# Patient Record
Sex: Female | Born: 1990 | Race: White | Hispanic: No | Marital: Single | Smoking: Never smoker
Health system: Southern US, Community
[De-identification: ages and names within clinical notes are randomized; demographics above are authoritative.]

## PROBLEM LIST (undated history)

## (undated) DIAGNOSIS — B019 Varicella without complication: Secondary | ICD-10-CM

## (undated) HISTORY — DX: Varicella without complication: B01.9

---

## 2014-12-18 ENCOUNTER — Ambulatory Visit (INDEPENDENT_AMBULATORY_CARE_PROVIDER_SITE_OTHER): Payer: BC Managed Care – PPO | Admitting: Primary Care

## 2014-12-18 ENCOUNTER — Encounter: Payer: Self-pay | Admitting: Primary Care

## 2014-12-18 VITALS — BP 118/76 | HR 86 | Temp 98.0°F | Ht 65.0 in | Wt 156.8 lb

## 2014-12-18 DIAGNOSIS — Z7189 Other specified counseling: Secondary | ICD-10-CM | POA: Diagnosis not present

## 2014-12-18 DIAGNOSIS — Z7689 Persons encountering health services in other specified circumstances: Secondary | ICD-10-CM

## 2014-12-18 NOTE — Progress Notes (Signed)
   Subjective:    Patient ID: Terry [name], female    DOB: 26-May-1991, 24 y.o.   MRN: [id number]  HPI  Ms. Terry [name] is a 24 year old female who presents today to establish care. She has no complaints today.   Review of Systems  Constitutional: Negative for unexpected weight change.  HENT: Negative for rhinorrhea.   Respiratory: Negative for cough and shortness of breath.   Cardiovascular: Negative for chest pain.  Gastrointestinal: Negative for diarrhea and constipation.  Genitourinary: Negative for dysuria and frequency.  Musculoskeletal: Negative for myalgias and arthralgias.  Skin: Negative for rash.  Neurological: Negative for dizziness and headaches.  Psychiatric/Behavioral:       Denies concerns for anxiety or depression       Past Medical History  Diagnosis Date  . Chicken pox     History   Social History  . Marital Status: Single    Spouse Name: N/A  . Number of Children: N/A  . Years of Education: N/A   Occupational History  . Not on file.   Social History Main Topics  . Smoking status: Never Smoker   . Smokeless tobacco: Not on file  . Alcohol Use: 0.0 oz/week    0 Standard drinks or equivalent per week     Comment: social  . Drug Use: Not on file  . Sexual Activity: Not on file   Other Topics Concern  . Not on file   Social History Narrative   Single.   Highest level of education completed Bachelors Degree.   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   Enjoys reading, running, hiking, camping.       History reviewed. No pertinent past surgical history.  Family History  Problem Relation Age of Onset  . Hypertension Mother   . Mental retardation Mother   . Cancer Maternal Grandmother     breast  . Hypertension Maternal Grandmother     No Known Allergies  No current outpatient prescriptions on file prior to visit.   No current facility-administered medications on file prior to visit.    BP 118/76 mmHg  Pulse 102  Temp(Src) 98 F  (36.7 C) (Oral)  Ht 5\' 5"  (1.651 m)  Wt 156 lb 12.8 oz (71.124 kg)  BMI 26.09 kg/m2  SpO2 98%  LMP 12/15/2014    Objective:   Physical Exam  Constitutional: She is oriented to person, place, and time. She appears well-nourished.  Cardiovascular: Normal rate and regular rhythm.   Pulmonary/Chest: Effort normal and breath sounds normal.  Neurological: She is alert and oriented to person, place, and time.  Skin: Skin is warm and dry.  Psychiatric: She has a normal mood and affect.          Assessment & Plan:

## 2014-12-18 NOTE — Patient Instructions (Signed)
Please schedule a physical with me in the next 2-3 months. You will also schedule a lab only appointment one week prior. We will discuss your lab results during your physical.  It was a pleasure to meet you today! Please don't hesitate to call me with any questions. Welcome to [facility]!

## 2014-12-18 NOTE — Progress Notes (Signed)
Pre visit review using our clinic review tool, if applicable. No additional management support is needed unless otherwise documented below in the visit note. 

## 2015-01-26 ENCOUNTER — Other Ambulatory Visit: Payer: Self-pay | Admitting: Primary Care

## 2015-01-26 DIAGNOSIS — Z0000 Encounter for general adult medical examination without abnormal findings: Secondary | ICD-10-CM

## 2015-02-01 ENCOUNTER — Other Ambulatory Visit (INDEPENDENT_AMBULATORY_CARE_PROVIDER_SITE_OTHER): Payer: BC Managed Care – PPO

## 2015-02-01 DIAGNOSIS — Z136 Encounter for screening for cardiovascular disorders: Secondary | ICD-10-CM | POA: Diagnosis not present

## 2015-02-01 DIAGNOSIS — Z0000 Encounter for general adult medical examination without abnormal findings: Secondary | ICD-10-CM

## 2015-02-01 LAB — CBC
HCT: 39.3 % (ref 36.0–46.0)
HEMOGLOBIN: 13.3 g/dL (ref 12.0–15.0)
MCHC: 33.9 g/dL (ref 30.0–36.0)
MCV: 90 fl (ref 78.0–100.0)
Platelets: 186 10*3/uL (ref 150.0–400.0)
RBC: 4.36 Mil/uL (ref 3.87–5.11)
RDW: 13.1 % (ref 11.5–15.5)
WBC: 4.9 10*3/uL (ref 4.0–10.5)

## 2015-02-01 LAB — LIPID PANEL
Cholesterol: 165 mg/dL (ref 0–200)
HDL: 56.3 mg/dL (ref >39.00)
LDL CALC: 100 mg/dL — AB (ref 0–99)
NonHDL: 109.15
Total CHOL/HDL Ratio: 3
Triglycerides: 44 mg/dL (ref 0.0–149.0)
VLDL: 8.8 mg/dL (ref 0.0–40.0)

## 2015-02-01 LAB — COMPREHENSIVE METABOLIC PANEL
ALT: 10 U/L (ref 0–35)
AST: 16 U/L (ref 0–37)
Albumin: 4.4 g/dL (ref 3.5–5.2)
Alkaline Phosphatase: 50 U/L (ref 39–117)
BILIRUBIN TOTAL: 0.7 mg/dL (ref 0.2–1.2)
BUN: 13 mg/dL (ref 6–23)
CALCIUM: 9.4 mg/dL (ref 8.4–10.5)
CHLORIDE: 102 meq/L (ref 96–112)
CO2: 26 meq/L (ref 19–32)
Creatinine, Ser: 0.77 mg/dL (ref 0.40–1.20)
GFR: 97.63 mL/min (ref >60.00)
GLUCOSE: 90 mg/dL (ref 70–99)
Potassium: 3.8 mEq/L (ref 3.5–5.1)
Sodium: 136 mEq/L (ref 135–145)
Total Protein: 7.1 g/dL (ref 6.0–8.3)

## 2015-02-01 LAB — TSH: TSH: 2.35 u[IU]/mL (ref 0.35–4.50)

## 2015-02-06 ENCOUNTER — Encounter: Payer: BC Managed Care – PPO | Admitting: Primary Care

## 2015-02-07 ENCOUNTER — Encounter: Payer: Self-pay | Admitting: Primary Care

## 2015-02-07 ENCOUNTER — Ambulatory Visit (INDEPENDENT_AMBULATORY_CARE_PROVIDER_SITE_OTHER): Payer: BC Managed Care – PPO | Admitting: Primary Care

## 2015-02-07 VITALS — BP 112/72 | HR 87 | Temp 98.5°F | Ht 65.0 in | Wt 157.8 lb

## 2015-02-07 DIAGNOSIS — Z0000 Encounter for general adult medical examination without abnormal findings: Secondary | ICD-10-CM | POA: Diagnosis not present

## 2015-02-07 NOTE — Progress Notes (Signed)
Subjective:    Patient ID: Terry [name], female    DOB: 05-Sep-1990, 24 y.o.   MRN: [id number]  HPI  Terry [name] is a 24 year old female who presents today for complete physical.  Immunizations: -Tetanus: Completed in 2010. -Influenza: Did receive last season.   Diet: She endorses a healthy diet. Breakfast: Oatmeal or cereal Lunch: Salads, beans and rice, sandwiches Dinner: Pasta, chicken, vegetables Desserts occasionally. Beverages: Mostly water, coffee in the morning. Exercise: She runs on the treadmill 3-4 days weekly. During the school year also walks and uses elliptical. Eye exam: Completed October 2015. Dental exam: Needs to make an appointment Pap Smear: Complete in 2014.   Review of Systems  Constitutional: Negative for unexpected weight change.  HENT: Negative for rhinorrhea.   Respiratory: Negative for cough and shortness of breath.   Cardiovascular: Negative for chest pain.  Gastrointestinal: Negative for diarrhea and constipation.  Genitourinary: Negative for difficulty urinating.       Regular periods  Musculoskeletal: Negative for myalgias and arthralgias.  Skin: Negative for rash.  Neurological: Negative for dizziness, numbness and headaches.  Psychiatric/Behavioral:       Denies concerns for anxiety or depression       Past Medical History  Diagnosis Date  . Chicken pox     Social History   Social History  . Marital Status: Single    Spouse Name: N/A  . Number of Children: N/A  . Years of Education: N/A   Occupational History  . Not on file.   Social History Main Topics  . Smoking status: Never Smoker   . Smokeless tobacco: Not on file  . Alcohol Use: 0.0 oz/week    0 Standard drinks or equivalent per week     Comment: social  . Drug Use: Not on file  . Sexual Activity: Not on file   Other Topics Concern  . Not on file   Social History Narrative   Single.   Highest level of education completed Bachelors Degree.   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   Enjoys reading, running, hiking, camping.       No past surgical history on file.  Family History  Problem Relation Age of Onset  . Hypertension Mother   . Mental retardation Mother   . Cancer Maternal Grandmother     breast  . Hypertension Maternal Grandmother     No Known Allergies  No current outpatient prescriptions on file prior to visit.   No current facility-administered medications on file prior to visit.    BP 112/72 mmHg  Pulse 87  Temp(Src) 98.5 F (36.9 C) (Oral)  Ht  (1.651 m)  Wt 157 lb 12.8 oz (71.578 kg)  BMI 26.26 kg/m2  SpO2 98%  LMP 02/06/2015    Objective:   Physical Exam  Constitutional: She is oriented to person, place, and time. She appears well-nourished.  HENT:  Right Ear: Tympanic membrane and ear canal normal.  Left Ear: Tympanic membrane and ear canal normal.  Mouth/Throat: Oropharynx is clear and moist.  Eyes: Conjunctivae and EOM are normal. Pupils are equal, round, and reactive to light.  Neck: Neck supple. No thyromegaly present.  Cardiovascular: Normal rate and regular rhythm.   Pulmonary/Chest: Effort normal and breath sounds normal.  Abdominal: Soft. Bowel sounds are normal. There is no tenderness.  Musculoskeletal: Normal range of motion.  Lymphadenopathy:    She has no cervical adenopathy.  Neurological: She is alert and oriented to person,  place, and time. She has normal reflexes. No cranial nerve deficit.  Skin: Skin is warm and dry.  Psychiatric: She has a normal mood and affect.          Assessment & Plan:

## 2015-02-07 NOTE — Assessment & Plan Note (Signed)
Tetanus due in 2020. Pap due in 2017. Labs unremarkable. Exam unremarkable Discussed the importance of healthy diet and regular exercise as she is already working towards. Follow up in 1 year.

## 2015-02-07 NOTE — Patient Instructions (Signed)
Your labs look great!  Continue working on exercise and eating a healthy diet.  Follow up in one year for physical and pap.  It was a pleasure to see you today!

## 2015-02-07 NOTE — Progress Notes (Signed)
Pre visit review using our clinic review tool, if applicable. No additional management support is needed unless otherwise documented below in the visit note. 

## 2016-04-25 ENCOUNTER — Ambulatory Visit (INDEPENDENT_AMBULATORY_CARE_PROVIDER_SITE_OTHER): Payer: BC Managed Care – PPO | Admitting: Primary Care

## 2016-04-25 ENCOUNTER — Encounter: Payer: Self-pay | Admitting: Primary Care

## 2016-04-25 ENCOUNTER — Other Ambulatory Visit (HOSPITAL_COMMUNITY)
Admission: RE | Admit: 2016-04-25 | Discharge: 2016-04-25 | Disposition: A | Discharge status: Home / Self Care | Payer: BC Managed Care – PPO | Source: Ambulatory Visit | Attending: Primary Care | Admitting: Primary Care

## 2016-04-25 VITALS — BP 126/84 | HR 98 | Temp 98.0°F | Ht 64.5 in | Wt 133.0 lb

## 2016-04-25 DIAGNOSIS — Z0000 Encounter for general adult medical examination without abnormal findings: Secondary | ICD-10-CM

## 2016-04-25 DIAGNOSIS — Z01419 Encounter for gynecological examination (general) (routine) without abnormal findings: Secondary | ICD-10-CM | POA: Diagnosis present

## 2016-04-25 DIAGNOSIS — Z1151 Encounter for screening for human papillomavirus (HPV): Secondary | ICD-10-CM | POA: Diagnosis present

## 2016-04-25 NOTE — Patient Instructions (Addendum)
Complete lab work prior to leaving today. I will notify you of your results once received.   We will notify you once we receive the results of your pap.  Continue your efforts towards a healthy lifestyle through healthy diet and regular exercise.  Ensure you are consuming 64 ounces of water daily.  Follow up in 1 year for your annual physical.  It was a pleasure to see you today!

## 2016-04-25 NOTE — Assessment & Plan Note (Signed)
Immunizations UTD. Pap due, completed today, pending results. Breast exam completed, normal. Exam unremarkable. Labs pending. Commended her on her healthy lifestyle, encouraged her to continue. Follow up in 1 year for repeat physical.

## 2016-04-25 NOTE — Progress Notes (Signed)
Subjective:    Patient ID: Terry [name], female    DOB: 10-Oct-1990, 25 y.o.   MRN: [id number]  HPI  Ms. Basulto is a 25 year old female who presents today for complete physical.  Immunizations: -Tetanus: Completed in 2010 -Influenza: Completed in October 2017   Diet: She endorses a fair diet. Breakfast: Oatmeal, banana, coffe Lunch: Beans, rice, fruit, soup Dinner: Chicken, vegetables, fish Snacks: Occasionally, crackers, cheese Desserts: Occasionally  Beverages: Water  Exercise: She runs 10-15 miles weekly Eye exam: Completed in October 2016 Dental exam: Completes semi-annually  Pap Smear: Due.    Review of Systems  Constitutional: Negative for unexpected weight change.  HENT: Negative for rhinorrhea.   Respiratory: Negative for cough and shortness of breath.   Cardiovascular: Negative for chest pain.  Gastrointestinal: Negative for constipation and diarrhea.  Genitourinary: Negative for difficulty urinating and menstrual problem.  Musculoskeletal: Negative for arthralgias and myalgias.  Skin: Negative for rash.  Allergic/Immunologic: Positive for environmental allergies.  Neurological: Negative for dizziness, numbness and headaches.  Psychiatric/Behavioral:       Denies concerns for anxiety and depression       Past Medical History:  Diagnosis Date  . Chicken pox      Social History   Social History  . Marital status: Single    Spouse name: N/A  . Number of children: N/A  . Years of education: N/A   Occupational History  . Not on file.   Social History Main Topics  . Smoking status: Never Smoker  . Smokeless tobacco: Not on file  . Alcohol use 0.0 oz/week     Comment: social  . Drug use: Unknown  . Sexual activity: Not on file   Other Topics Concern  . Not on file   Social History Narrative   Single.   Highest level of education completed Bachelors Degree.   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   Enjoys reading, running, hiking,  camping.       No past surgical history on file.  Family History  Problem Relation Age of Onset  . Hypertension Mother   . Mental retardation Mother   . Cancer Maternal Grandmother     breast  . Hypertension Maternal Grandmother     No Known Allergies  No current outpatient prescriptions on file prior to visit.   No current facility-administered medications on file prior to visit.     BP 126/84 (BP Location: Left Arm, Patient Position: Sitting, Cuff Size: Normal)   Pulse 98   Temp 98 F (36.7 C) (Oral)   Ht 5' 4.5" (1.638 m)   Wt 133 lb (60.3 kg)   LMP 04/11/2016 (Exact Date)   SpO2 99%   BMI 22.48 kg/m    Objective:   Physical Exam  Constitutional: She is oriented to person, place, and time. She appears well-nourished.  HENT:  Right Ear: Tympanic membrane and ear canal normal.  Left Ear: Tympanic membrane and ear canal normal.  Nose: Nose normal.  Mouth/Throat: Oropharynx is clear and moist.  Eyes: Conjunctivae and EOM are normal. Pupils are equal, round, and reactive to light.  Neck: Neck supple. No thyromegaly present.  Cardiovascular: Normal rate and regular rhythm.   No murmur heard. Pulmonary/Chest: Effort normal and breath sounds normal. She has no rales. Right breast exhibits no mass, no skin change and no tenderness. Left breast exhibits no mass, no skin change and no tenderness.  Dense breast tissue bilaterally.  Abdominal: Soft. Bowel sounds are  normal. There is no tenderness.  Genitourinary: There is no lesion on the right labia. There is no lesion on the left labia. Cervix exhibits no motion tenderness and no discharge. Right adnexum displays no tenderness. Left adnexum displays no tenderness. No vaginal discharge found.  Musculoskeletal: Normal range of motion.  Lymphadenopathy:    She has no cervical adenopathy.  Neurological: She is alert and oriented to person, place, and time. She has normal reflexes. No cranial nerve deficit.  Skin: Skin is  warm and dry. No rash noted.  Psychiatric: She has a normal mood and affect.          Assessment & Plan:

## 2016-04-25 NOTE — Progress Notes (Signed)
Pre visit review using our clinic review tool, if applicable. No additional management support is needed unless otherwise documented below in the visit note. 

## 2016-04-26 LAB — COMPREHENSIVE METABOLIC PANEL
ALT: 11 U/L (ref 6–29)
AST: 17 U/L (ref 10–30)
Albumin: 4.2 g/dL (ref 3.6–5.1)
Alkaline Phosphatase: 45 U/L (ref 33–115)
BUN: 10 mg/dL (ref 7–25)
CHLORIDE: 103 mmol/L (ref 98–110)
CO2: 24 mmol/L (ref 20–31)
CREATININE: 0.68 mg/dL (ref 0.50–1.10)
Calcium: 8.9 mg/dL (ref 8.6–10.2)
Glucose, Bld: 96 mg/dL (ref 65–99)
POTASSIUM: 3.6 mmol/L (ref 3.5–5.3)
SODIUM: 138 mmol/L (ref 135–146)
TOTAL PROTEIN: 6.6 g/dL (ref 6.1–8.1)
Total Bilirubin: 0.5 mg/dL (ref 0.2–1.2)

## 2016-04-26 LAB — LIPID PANEL
CHOL/HDL RATIO: 2.3 ratio (ref <=5.0)
Cholesterol: 145 mg/dL (ref 125–200)
HDL: 62 mg/dL (ref >=46)
LDL CALC: 76 mg/dL (ref <130)
TRIGLYCERIDES: 34 mg/dL (ref <150)
VLDL: 7 mg/dL (ref <30)

## 2016-04-30 LAB — CYTOLOGY - PAP
Diagnosis: NEGATIVE
HPV (WINDOPATH): NOT DETECTED

## 2017-06-17 ENCOUNTER — Ambulatory Visit (INDEPENDENT_AMBULATORY_CARE_PROVIDER_SITE_OTHER): Payer: BC Managed Care – PPO | Admitting: Primary Care

## 2017-06-17 ENCOUNTER — Encounter: Payer: Self-pay | Admitting: Primary Care

## 2017-06-17 VITALS — BP 118/76 | HR 85 | Temp 98.0°F | Wt 129.8 lb

## 2017-06-17 DIAGNOSIS — Z0000 Encounter for general adult medical examination without abnormal findings: Secondary | ICD-10-CM | POA: Diagnosis not present

## 2017-06-17 NOTE — Patient Instructions (Signed)
Stop by the lab prior to leaving today. I will notify you of your results once received.   Continue exercising. You should be getting 150 minutes of moderate intensity exercise weekly.  Continue to work on a healthy diet.  Follow up in 1 year for your annual exam or sooner if needed.  It was a pleasure to see you today!

## 2017-06-17 NOTE — Assessment & Plan Note (Signed)
Immunizations UTD. Pap UTD. Commended her on a healthy diet and regular exercise. Exam unremarkable. Labs pending. Follow up in 1 year.

## 2017-06-17 NOTE — Progress Notes (Signed)
Subjective:    Patient ID: Terry [name], female    DOB: 06/26/1991, 26 y.o.   MRN: [id number]  HPI  Terry [name] is a 26 year old female who presents today for complete physical.  Immunizations: -Tetanus: Completed in 2010 -Influenza: Completed in October 2018   Diet: She endorses a healthy diet. Breakfast: Oatmeal, fruit, eggs Lunch: Soup, salad Dinner: Meat, vegetable, starch Snacks: Veggies with humus, fruit, trail mix Desserts: fruit mostly Beverages: Coffee, water, occasional diet soda  Exercise: She runs 10-15 miles weekly, low impact exercise videos  Eye exam: Completed in December 2018 Dental exam: Completes semi-annually Pap Smear: Negative in 2017   Review of Systems  Constitutional: Negative for unexpected weight change.  HENT: Negative for rhinorrhea.   Respiratory: Negative for cough and shortness of breath.   Cardiovascular: Negative for chest pain.  Gastrointestinal: Negative for constipation and diarrhea.  Genitourinary: Negative for difficulty urinating and menstrual problem.  Musculoskeletal: Negative for arthralgias and myalgias.  Skin: Negative for rash.  Allergic/Immunologic: Negative for environmental allergies.  Neurological: Negative for dizziness, numbness and headaches.  Psychiatric/Behavioral:       Denies concerns for anxiety and depression       Past Medical History:  Diagnosis Date  . Chicken pox      Social History   Socioeconomic History  . Marital status: Single    Spouse name: Not on file  . Number of children: Not on file  . Years of education: Not on file  . Highest education level: Not on file  Social Needs  . Financial resource strain: Not on file  . Food insecurity - worry: Not on file  . Food insecurity - inability: Not on file  . Transportation needs - medical: Not on file  . Transportation needs - non-medical: Not on file  Occupational History  . Not on file  Tobacco Use  . Smoking status: Never Smoker  .  Smokeless tobacco: Never Used  Substance and Sexual Activity  . Alcohol use: Yes    Alcohol/week: 0.0 oz    Comment: social  . Drug use: Not on file  . Sexual activity: Not on file  Other Topics Concern  . Not on file  Social History Narrative   Single.   Highest level of education completed Bachelors Degree.   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   Enjoys reading, running, hiking, camping.    No past surgical history on file.  Family History  Problem Relation Age of Onset  . Hypertension Mother   . Mental retardation Mother   . Cancer Maternal Grandmother        breast  . Hypertension Maternal Grandmother     No Known Allergies  No current outpatient medications on file prior to visit.   No current facility-administered medications on file prior to visit.     BP 118/76   Pulse 85   Temp 98 F (36.7 C) (Oral)   Wt 129 lb 12.8 oz (58.9 kg)   LMP 05/31/2017   SpO2 98%   BMI 21.94 kg/m    Objective:   Physical Exam  Constitutional: She is oriented to person, place, and time. She appears well-nourished.  HENT:  Right Ear: Tympanic membrane and ear canal normal.  Left Ear: Tympanic membrane and ear canal normal.  Nose: Nose normal.  Mouth/Throat: Oropharynx is clear and moist.  Eyes: Conjunctivae and EOM are normal. Pupils are equal, round, and reactive to light.  Neck: Neck supple. No  thyromegaly present.  Cardiovascular: Normal rate and regular rhythm.  No murmur heard. Pulmonary/Chest: Effort normal and breath sounds normal. She has no rales.  Abdominal: Soft. Bowel sounds are normal. There is no tenderness.  Musculoskeletal: Normal range of motion.  Lymphadenopathy:    She has no cervical adenopathy.  Neurological: She is alert and oriented to person, place, and time. She has normal reflexes. No cranial nerve deficit.  Skin: Skin is warm and dry. No rash noted.  Psychiatric: She has a normal mood and affect.          Assessment & Plan:

## 2017-06-18 LAB — COMPREHENSIVE METABOLIC PANEL
ALBUMIN: 4.2 g/dL (ref 3.5–5.2)
ALK PHOS: 47 U/L (ref 39–117)
ALT: 11 U/L (ref 0–35)
AST: 16 U/L (ref 0–37)
BILIRUBIN TOTAL: 0.4 mg/dL (ref 0.2–1.2)
BUN: 18 mg/dL (ref 6–23)
CALCIUM: 9 mg/dL (ref 8.4–10.5)
CO2: 27 mEq/L (ref 19–32)
Chloride: 102 mEq/L (ref 96–112)
Creatinine, Ser: 0.89 mg/dL (ref 0.40–1.20)
GFR: 81.05 mL/min (ref >60.00)
GLUCOSE: 90 mg/dL (ref 70–99)
POTASSIUM: 3.5 meq/L (ref 3.5–5.1)
Sodium: 136 mEq/L (ref 135–145)
TOTAL PROTEIN: 7 g/dL (ref 6.0–8.3)

## 2017-06-18 LAB — LIPID PANEL
CHOLESTEROL: 141 mg/dL (ref 0–200)
HDL: 58.8 mg/dL (ref >39.00)
LDL Cholesterol: 72 mg/dL (ref 0–99)
NONHDL: 82.45
TRIGLYCERIDES: 52 mg/dL (ref 0.0–149.0)
Total CHOL/HDL Ratio: 2
VLDL: 10.4 mg/dL (ref 0.0–40.0)

## 2018-03-30 ENCOUNTER — Ambulatory Visit: Payer: BC Managed Care – PPO | Admitting: Family Medicine

## 2018-03-30 ENCOUNTER — Ambulatory Visit (INDEPENDENT_AMBULATORY_CARE_PROVIDER_SITE_OTHER)
Admission: RE | Admit: 2018-03-30 | Discharge: 2018-03-30 | Disposition: A | Discharge status: Home / Self Care | Payer: BC Managed Care – PPO | Source: Ambulatory Visit | Attending: Family Medicine | Admitting: Family Medicine

## 2018-03-30 ENCOUNTER — Encounter: Payer: Self-pay | Admitting: Family Medicine

## 2018-03-30 VITALS — BP 116/74 | HR 93 | Temp 99.2°F | Ht 64.5 in | Wt 133.5 lb

## 2018-03-30 DIAGNOSIS — M79673 Pain in unspecified foot: Secondary | ICD-10-CM | POA: Diagnosis not present

## 2018-03-30 NOTE — Patient Instructions (Signed)
Limit weight bearing, don't run.  Use the CAM walker when weight bearing.  Recheck with Dr. Patsy Lager in 2 weeks.   Elevate your foot when possible.  Don't drive in the boot.  Take care.  Glad to see you.

## 2018-03-30 NOTE — Progress Notes (Signed)
Foot pain.  Was running Sunday, didn't have pain during the run.  Then had a little pain afterward.  Then more pain yesterday, had swelling then.  Could bear weight yesterday but pain with weightbearing today.  More pain today.  Used ibuprofen and icing.    Ran 12 miles Saturday, 2 miles Sunday.  Typically running 8-10 max per day.  She has done 4 half marathons.  She ran at typical pace recently.  She was getting ready for another half marathon.  No L foot pain. She teaches at [employer].    Never felt a pop or snap.  Pain along R 1st distal MT.    She is not pregnant based on LMP.    Meds, vitals, and allergies reviewed.   ROS: Per HPI unless specifically indicated in ROS section   nad R foot with normal inspection.  Not tender to palpation at the medial or lateral malleoli.  Achilles and calcaneus are nontender.  Normal dorsalis pedis pulse.  Midfoot is not tender except for pain along R 1st distal MT.  She has pain with flexion and extension of the great toe but no specific MTP erythema.  Distally neurovascularly intact.  Pain with weightbearing.  X-rays reviewed and discussed with patient.  No significant acute findings.  No fracture seen.  There is a possible small subcutaneous calcification on the plantar aspect of the foot.  Unclear clinical significance.  All discussed with patient.

## 2018-03-31 DIAGNOSIS — M79673 Pain in unspecified foot: Secondary | ICD-10-CM | POA: Insufficient documentation

## 2018-03-31 NOTE — Assessment & Plan Note (Signed)
Distance runner.  Differential diagnosis discussed with patient.  She could have an occult stress fracture.  Discussed other options such as stress reaction.  Unclear significance of the calcification seen on the x-ray.  In any event it would make sense to treat her conservatively.  Placed in cam walker boot.  She felt better with that.  She could weight-bear more easily and with less pain with that.  Routine cautions given, no driving in the boot.  I want her to wear this when weightbearing for the next 2 weeks and then follow-up with Dr. Patsy Lager.  If worse in the meantime she will let us know.  Reasonable to ice and elevate otherwise.  Update me as needed.  She agrees. >25 minutes spent in face to face time with patient, >50% spent in counselling or coordination of care

## 2018-04-14 ENCOUNTER — Ambulatory Visit: Payer: BC Managed Care – PPO | Admitting: Family Medicine

## 2018-04-14 ENCOUNTER — Ambulatory Visit (INDEPENDENT_AMBULATORY_CARE_PROVIDER_SITE_OTHER)
Admission: RE | Admit: 2018-04-14 | Discharge: 2018-04-14 | Disposition: A | Discharge status: Home / Self Care | Payer: BC Managed Care – PPO | Source: Ambulatory Visit | Attending: Family Medicine | Admitting: Family Medicine

## 2018-04-14 ENCOUNTER — Encounter: Payer: Self-pay | Admitting: Family Medicine

## 2018-04-14 ENCOUNTER — Other Ambulatory Visit: Payer: Self-pay | Admitting: Family Medicine

## 2018-04-14 VITALS — BP 100/70 | HR 71 | Temp 98.2°F | Ht 64.5 in | Wt 131.2 lb

## 2018-04-14 DIAGNOSIS — M79672 Pain in left foot: Secondary | ICD-10-CM

## 2018-04-14 NOTE — Patient Instructions (Signed)
REFERRALS TO SPECIALISTS, SPECIAL TESTS (MRI, CT, ULTRASOUNDS)  MARION or  Anastasiya will help you. ASK CHECK-IN FOR HELP.  Specialist appointment times vary a great deal, based on their schedule / openings. -- Some specialists have very long wait times. (Example. Dermatology)    

## 2018-04-14 NOTE — Progress Notes (Signed)
Dr. Karleen Hampshire T. Dvon Jiles, MD, CAQ Sports Medicine [facility] [address] [place], [address] Phone: [phone] Fax: [phone]  04/14/2018  Patient: Terry [name], MRN: [id number], DOB: 1991-02-01, 27 y.o.  Primary Physician:  Doreene Nest, NP   Chief Complaint  Patient presents with  . Follow-up    Right Foot Pain   Subjective:   Terry [name] is a 27 y.o. very pleasant female patient who presents with the following:  Runs 1/2 marathons  L foot, 1st. Saw Dr. Para March 03/30/2018.  At that point he was worried for some type of occult injury not seen on plain film, and potential stress fracture.  She was placed in a short cam walker boot.  Since that time, she has been nonweightbearing as able, she does have a scooter that she uses much of the time.  She does work as a Systems developer.  This does cause her to be on her feet quite a bit.  She was training for half marathon that ultimately would have been last weekend.  At the end of her training she sustained some pain and primarily the first metatarsal, but she has pain across metatarsals 1 through 3.  There is not some swelling but no significant bruising.    Past Medical History, Surgical History, Social History, Family History, Problem List, Medications, and Allergies have been reviewed and updated if relevant.  Patient Active Problem List   Diagnosis Date Noted  . Pain of foot 03/31/2018  . Preventative health care 02/07/2015    Past Medical History:  Diagnosis Date  . Chicken pox     No past surgical history on file.  Social History   Socioeconomic History  . Marital status: Single    Spouse name: Not on file  . Number of children: Not on file  . Years of education: Not on file  . Highest education level: Not on file  Occupational History  . Not on file  Social Needs  . Financial resource strain: Not on file  . Food insecurity:    Worry: Not on file    Inability: Not on  file  . Transportation needs:    Medical: Not on file    Non-medical: Not on file  Tobacco Use  . Smoking status: Never Smoker  . Smokeless tobacco: Never Used  Substance and Sexual Activity  . Alcohol use: Yes    Alcohol/week: 0.0 standard drinks    Comment: social  . Drug use: Not on file  . Sexual activity: Not on file  Lifestyle  . Physical activity:    Days per week: Not on file    Minutes per session: Not on file  . Stress: Not on file  Relationships  . Social connections:    Talks on phone: Not on file    Gets together: Not on file    Attends religious service: Not on file    Active member of club or organization: Not on file    Attends meetings of clubs or organizations: Not on file    Relationship status: Not on file  . Intimate partner violence:    Fear of current or ex partner: Not on file    Emotionally abused: Not on file    Physically abused: Not on file    Forced sexual activity: Not on file  Other Topics Concern  . Not on file  Social History Narrative   Single.   Highest level of education completed Bachelors  Degree.   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   Enjoys reading, running, hiking, camping.    Family History  Problem Relation Age of Onset  . Hypertension Mother   . Mental retardation Mother   . Cancer Maternal Grandmother        breast  . Hypertension Maternal Grandmother     No Known Allergies  Medication list reviewed and updated in full in [facility].  GEN: No fevers, chills. Nontoxic. Primarily MSK c/o today. MSK: Detailed in the HPI GI: tolerating PO intake without difficulty Neuro: No numbness, parasthesias, or tingling associated. Otherwise the pertinent positives of the ROS are noted above.   Objective:   BP 100/70   Pulse 71   Temp 98.2 F (36.8 C) (Oral)   Ht 5' 4.5" (1.638 m)   Wt 131 lb 4 oz (59.5 kg) Comment: with cam walker  LMP 04/03/2018   BMI 22.18 kg/m    GEN: WDWN, NAD, Non-toxic, Alert  & Oriented x 3 HEENT: Atraumatic, Normocephalic.  Ears and Nose: No external deformity. EXTR: No clubbing/cyanosis/edema NEURO: Normal gait.  PSYCH: Normally interactive. Conversant. Not depressed or anxious appearing.  Calm demeanor.    There is no significant pain throughout the ankle, tibia, fibula, talus.  There is no significant pain in the navicular or cuboid.  There is no pain along the fifth metatarsal.  There is no pain in or minimal pain in the fourth metatarsal.  Along the second and third shafts there is some significant pain to palpation.  There is dramatic pain along the distal metaphysis of the first metatarsal.  All of these have pain with movement of the MTP joint.  Radiology: Dg Foot Complete Right  Result Date: 04/14/2018 CLINICAL DATA:  Pain at the first metatarsal. EXAM: RIGHT FOOT COMPLETE - 3+ VIEW COMPARISON:  None. FINDINGS: There is no evidence of fracture or dislocation. No periosteal new bone formation. There is no evidence of arthropathy or other focal bone abnormality. Unchanged plantar soft tissue calcification. IMPRESSION: No fracture of the right foot. No periosteal new bone formation that would indicate a radio-occult fracture. Electronically Signed   By: Deatra Robinson M.D.   On: 04/14/2018 14:58   Dg Foot Complete Right  Result Date: 03/30/2018 CLINICAL DATA:  Pain at the first MTP joint.  Distance runner. EXAM: RIGHT FOOT COMPLETE - 3+ VIEW COMPARISON:  None. FINDINGS: The joint spaces are maintained. No acute bony findings or erosive changes. No obvious stress fracture. On the lateral film there appear to be subtle soft tissue calcifications in the plantar aspect of the foot just superficial to the sesamoid bones of the great toe. This is a nonspecific finding and could be due to dystrophic calcifications related to prior trauma or possible foreign body CT may be helpful for further evaluation if clinically necessary. IMPRESSION: No acute bony findings or  significant degenerative changes. Suspect small subcutaneous soft tissue calcifications along the plantar aspect of the foot at the level of the first metatarsal head. Electronically Signed   By: Rudie Meyer M.D.   On: 03/30/2018 15:46    Assessment and Plan:   Left foot pain - Plan: MR FOOT RIGHT WO CONTRAST, CANCELED: DG Foot Complete Left, CANCELED: DG Foot Complete Left  In a young female half marathon runner with severe pain at the first metatarsal as well as significant pain at the second and third metatarsals, there is concern for multiple stress fractures missed on plain film.  We  will obtain an MRI of the foot without contrast to evaluate for stress fracture or stress reaction of the first through third metatarsals.  At this point she is failed conservative management thus far.  I encouraged her to remain nonweightbearing as much as possible and remain in her rigid pneumatic cam walker boot except for when she is in bed.  Follow-up: No follow-ups on file.  Orders Placed This Encounter  Procedures  . MR FOOT RIGHT WO CONTRAST    Signed,  Melynda Krzywicki T. Kashauna Celmer, MD   Allergies as of 04/14/2018   No Known Allergies     Medication List    as of 04/14/2018 11:59 PM   You have not been prescribed any medications.

## 2018-04-15 ENCOUNTER — Encounter: Payer: Self-pay | Admitting: Family Medicine

## 2018-04-23 ENCOUNTER — Ambulatory Visit
Admission: RE | Admit: 2018-04-23 | Discharge: 2018-04-23 | Disposition: A | Discharge status: Home / Self Care | Payer: BC Managed Care – PPO | Source: Ambulatory Visit | Attending: Family Medicine | Admitting: Family Medicine

## 2018-04-23 DIAGNOSIS — M79672 Pain in left foot: Secondary | ICD-10-CM | POA: Diagnosis present

## 2018-04-27 NOTE — Telephone Encounter (Signed)
Copied from CRM [id number]. Topic: General - Other >> Apr 27, 2018  5:02 PM Tamela Oddi wrote: Reason for CRM: Patient stated that she is returning a call to Dr. Chestine Spore.  Please advise and call patient back at [phone]

## 2018-04-28 NOTE — Telephone Encounter (Signed)
Terry [name], this is a patient of mine who's been seeing Dr. Patsy Lager for foot pain. Can you get her set up with one of the other sports med docs within [facility]?

## 2018-04-29 ENCOUNTER — Encounter: Payer: Self-pay | Admitting: Sports Medicine

## 2018-04-29 ENCOUNTER — Ambulatory Visit: Payer: BC Managed Care – PPO | Admitting: Sports Medicine

## 2018-04-29 VITALS — BP 120/82 | HR 81 | Ht 64.5 in | Wt 130.6 lb

## 2018-04-29 DIAGNOSIS — M19079 Primary osteoarthritis, unspecified ankle and foot: Secondary | ICD-10-CM

## 2018-04-29 DIAGNOSIS — M79672 Pain in left foot: Secondary | ICD-10-CM | POA: Diagnosis not present

## 2018-04-29 MED ORDER — PREDNISONE 20 MG PO TABS: ORAL_TABLET | ORAL | 0 refills | Status: DC

## 2018-04-29 NOTE — Assessment & Plan Note (Signed)
See overview for description of the MRI.  Ultimately further diagnostic evaluation for potential connective tissue disorder recommended.  Continue with boot immobilization and minimize weightbearing.  10-day steroid taper provided.  Icing as needed.  2-week follow-up.  Will likely plan to ultrasound at the MTP first MTP bursal swelling at that time depending on extent of improvement.  Given the edema in the distal phalanxes ultimately this will likely need to be treated like a stress fracture and have a very slow return to activities.

## 2018-04-29 NOTE — Progress Notes (Signed)
Terry [name]. Terry [name] Sports Medicine [facility] at [facility] [phone]  Terry [name] - 27 y.o. female MRN [id number]  Date of birth: [birth year]  Visit Date: 04/29/2018  PCP: Doreene Nest, NP   Referred by: Doreene Nest, NP   Scribe(s) for today's visit: Stevenson Clinch, CMA  SUBJECTIVE:  Terry [name] is here for intermetatarsal bursitis   HPI: Her R foot pain symptoms INITIALLY: Began 03/28/2018 and began after going for a run.  Described as moderate constant dull pain with occasional sharp pain in the ball of the foot and toes. She notices "electric shock" pain when weight bearing. At times there is numbness and tingling in the ball of the foot and toes. Pain is nonradiating. Worsened with weightbearing, walking.  Improved with rest.  Additional associated symptoms include: She still has some swelling, no bruising. Sometimes she will have pain even at rest.     At this time symptoms are improving compared to onset, pain has improved but swelling has stayed about the same. She did more walking yesterday and feels that has caused the pain to flare up again.  She has been taking IBU, icing, and elevating with some relief. She has ben placed in a short cam walker boot, advised to be nonweightbearing when possible, and uses scooter while at work.   REVIEW OF SYSTEMS: Denies night time disturbances. Denies fevers, chills, or night sweats. Denies unexplained weight loss. Denies personal history of cancer. Denies changes in bowel or bladder habits. Denies recent unreported falls. Denies new or worsening dyspnea or wheezing. Denies headaches or dizziness.  Reports numbness, tingling or weakness in the extremities.  Denies dizziness or presyncopal episodes Reports lower extremity edema    HISTORY:  Prior history reviewed and updated per electronic medical record.  Social History   Occupational History  . Not on file  Tobacco Use  . Smoking  status: Never Smoker  . Smokeless tobacco: Never Used  Substance and Sexual Activity  . Alcohol use: Yes    Alcohol/week: 0.0 standard drinks    Comment: social  . Drug use: Not on file  . Sexual activity: Not on file   Social History   Social History Narrative   Single.   Highest level of education completed Bachelors Degree.   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   Enjoys reading, running, hiking, camping.    Past Medical History:  Diagnosis Date  . Chicken pox    No past surgical history on file. family history includes Cancer in her maternal grandmother; Hypertension in her maternal grandmother and mother; Mental retardation in her mother.  DATA OBTAINED & REVIEWED:  No results for input(s): HGBA1C, LABURIC, CREATINE in the last 8760 hours. Problem  Pain of Foot   MRI on 04/23/2018 show significant intermetatarsal bursitis as well as a likely traumatic bursal swelling underneath the great toe at the MTP joint.  Additionally there is evidence of bony edema in the distal phalanxes of the first second third and trace only on the fourth rays.  I did call and confirm this with radiology who agrees that this does not appear to be an atypical edema pattern.    .   OBJECTIVE:  VS:  HT:5' 4.5" (163.8 cm)   WT:130 lb 9.6 oz (59.2 kg)  BMI:22.08    BP:120/82  HR:81bpm  TEMP: ( )  RESP:99 %   PHYSICAL EXAM: CONSTITUTIONAL: Well-developed, Well-nourished and In no acute distress PSYCHIATRIC: Alert &  appropriately interactive. and Not depressed or anxious appearing. RESPIRATORY: No increased work of breathing and Trachea Midline EYES: Pupils are equal., EOM intact without nystagmus. and No scleral icterus.  VASCULAR EXAM: Warm and well perfused Pulses: DP Pulses: Bilaterally normal and symmetric PT Pulses: Bilaterally normal and symmetric Edema: Joint associated swelling as per Orthopedic exam and No Pre-tibial edema NEURO: unremarkable  MSK Exam: Right foot    Well aligned foot with diffuse swelling of the entire forefoot and midfoot.   Slight pallor of the first second and third distal phalanxes. TTP over Over the entire midfoot more focally over the first second and third rays.  She has marked pain with metatarsal squeeze test.  Marked pain with palpation of the first MTP on the plantar surface.  She has pain with tuning fork test over the first second third and slightly over the fourth toes that is mild but does cause her to startle.   RANGE OF MOTION & STRENGTH  Normal ankle dorsiflexion, plantarflexion, inversion and eversion.  Well-maintained strength.   SPECIALITY TESTING:  Marked pain with metatarsal squeeze test.     ASSESSMENT   1. Left foot pain   2. Inflammation of foot joint     PLAN:  Pertinent additional documentation may be included in corresponding procedure notes, imaging studies, problem based documentation and patient instructions.  Procedures:  . None  Medications:  Meds ordered this encounter  Medications  . predniSONE (DELTASONE) 20 MG tablet    Sig: Take 3tabs PO QAM x3days, 2tabs PO QAM x3days, 1tab PO QAM x4days    Dispense:  19 tablet    Refill:  0   Discussion/Instructions: Pain of foot See overview for description of the MRI.  Ultimately further diagnostic evaluation for potential connective tissue disorder recommended.  Continue with boot immobilization and minimize weightbearing.  10-day steroid taper provided.  Icing as needed.  2-week follow-up.  Will likely plan to ultrasound at the MTP first MTP bursal swelling at that time depending on extent of improvement.  Given the edema in the distal phalanxes ultimately this will likely need to be treated like a stress fracture and have a very slow return to activities.  . I did call and personally discussed case with the radiologist who agrees this and atypical edema pattern including in the distal phalanxes of the first second third digits.   Further diagnostic evaluation for mixed connective tissue disorder pursued today . Discussed red flag symptoms that warrant earlier emergent evaluation and patient voices understanding. . Activity modifications and the importance of avoiding exacerbating activities (limiting pain to no more than a 4 / 10 during or following activity) recommended and discussed. . >50% of this 25 minutes minute visit spent in direct patient counseling and/or coordination of care. Discussion was focused on education regarding the in discussing the pathoetiology and anticipated clinical course of the above condition.  Follow-up:  . Return in about 2 weeks (around 05/13/2018).  . If any lack of improvement consider: Referral to rheumatology.     CMA/ATC served as Neurosurgeon during this visit. History, Physical, and Plan performed by medical provider. Documentation and orders reviewed and attested to.      Andrena Mews, DO     Sports Medicine Physician

## 2018-04-30 LAB — COMPREHENSIVE METABOLIC PANEL
ALBUMIN: 4.7 g/dL (ref 3.5–5.2)
ALT: 11 U/L (ref 0–35)
AST: 16 U/L (ref 0–37)
Alkaline Phosphatase: 47 U/L (ref 39–117)
BUN: 13 mg/dL (ref 6–23)
CHLORIDE: 103 meq/L (ref 96–112)
CO2: 26 meq/L (ref 19–32)
Calcium: 9.6 mg/dL (ref 8.4–10.5)
Creatinine, Ser: 0.72 mg/dL (ref 0.40–1.20)
GFR: 102.85 mL/min (ref >60.00)
GLUCOSE: 95 mg/dL (ref 70–99)
Potassium: 3.8 mEq/L (ref 3.5–5.1)
SODIUM: 139 meq/L (ref 135–145)
Total Bilirubin: 0.6 mg/dL (ref 0.2–1.2)
Total Protein: 7.1 g/dL (ref 6.0–8.3)

## 2018-04-30 LAB — CBC WITH DIFFERENTIAL/PLATELET
BASOS ABS: 0 10*3/uL (ref 0.0–0.1)
BASOS PCT: 0.5 % (ref 0.0–3.0)
EOS ABS: 0.1 10*3/uL (ref 0.0–0.7)
Eosinophils Relative: 2.3 % (ref 0.0–5.0)
HCT: 35.4 % — ABNORMAL LOW (ref 36.0–46.0)
HEMOGLOBIN: 11.9 g/dL — AB (ref 12.0–15.0)
Lymphocytes Relative: 26.8 % (ref 12.0–46.0)
Lymphs Abs: 1.2 10*3/uL (ref 0.7–4.0)
MCHC: 33.6 g/dL (ref 30.0–36.0)
MCV: 85.4 fl (ref 78.0–100.0)
MONO ABS: 0.4 10*3/uL (ref 0.1–1.0)
Monocytes Relative: 9 % (ref 3.0–12.0)
Neutro Abs: 2.8 10*3/uL (ref 1.4–7.7)
Neutrophils Relative %: 61.4 % (ref 43.0–77.0)
Platelets: 184 10*3/uL (ref 150.0–400.0)
RBC: 4.15 Mil/uL (ref 3.87–5.11)
RDW: 15.8 % — AB (ref 11.5–15.5)
WBC: 4.5 10*3/uL (ref 4.0–10.5)

## 2018-04-30 LAB — C-REACTIVE PROTEIN

## 2018-04-30 LAB — URIC ACID: Uric Acid, Serum: 4.3 mg/dL (ref 2.4–7.0)

## 2018-04-30 LAB — CK: CK TOTAL: 82 U/L (ref 7–177)

## 2018-04-30 LAB — SEDIMENTATION RATE: SED RATE: 8 mm/h (ref 0–20)

## 2018-05-03 LAB — CYCLIC CITRUL PEPTIDE ANTIBODY, IGG: Cyclic Citrullin Peptide Ab: <16 U

## 2018-05-03 LAB — RHEUMATOID FACTOR: Rheumatoid fact SerPl-aCnc: <14 [IU]/mL

## 2018-05-03 LAB — ANA: Anti Nuclear Antibody(ANA): NEGATIVE

## 2018-05-10 ENCOUNTER — Ambulatory Visit: Payer: BC Managed Care – PPO | Admitting: Sports Medicine

## 2018-05-10 ENCOUNTER — Encounter: Payer: Self-pay | Admitting: Sports Medicine

## 2018-05-10 VITALS — BP 110/72 | HR 80 | Ht 64.5 in | Wt 130.0 lb

## 2018-05-10 DIAGNOSIS — M19079 Primary osteoarthritis, unspecified ankle and foot: Secondary | ICD-10-CM

## 2018-05-10 DIAGNOSIS — M79671 Pain in right foot: Secondary | ICD-10-CM | POA: Diagnosis not present

## 2018-05-10 MED ORDER — ALENDRONATE SODIUM 70 MG PO TABS: 70.0000 mg | ORAL_TABLET | ORAL | 1 refills | Status: DC

## 2018-05-10 NOTE — Progress Notes (Signed)
Terry [name]. Terry [name] Sports Medicine [facility] [phone]  Terry [name] - 27 y.o. female MRN [id number]  Date of birth: [birth year]  Visit Date: 05/10/2018  PCP: Doreene Nest, NP   Referred by: Doreene Nest, NP   Scribe(s) for today's visit: Stevenson Clinch, CMA  SUBJECTIVE:  Terry [name] is here for Follow-up (R foot pain)   HPI: 04/29/2018: Her R foot pain symptoms INITIALLY: Began 03/28/2018 and began after going for a run.  Described as moderate constant dull pain with occasional sharp pain in the ball of the foot and toes. She notices "electric shock" pain when weight bearing. At times there is numbness and tingling in the ball of the foot and toes. Pain is nonradiating. Worsened with weightbearing, walking.  Improved with rest.  Additional associated symptoms include: She still has some swelling, no bruising. Sometimes she will have pain even at rest.    At this time symptoms are improving compared to onset, pain has improved but swelling has stayed about the same. She did more walking yesterday and feels that has caused the pain to flare up again.  She has been taking IBU, icing, and elevating with some relief. She has ben placed in a short cam walker boot, advised to be nonweightbearing when possible, and uses scooter while at work.   05/10/2018: Compared to the last office visit, her previously described symptoms are improving, the swelling has gone down a little bit. There hasn't been much change with pain in her toes.  Current symptoms are moderate & are nonradiating She has completed Prednisone taper. She has been compliant with minimal weight bearing and wearing boot. She has reviewed her labs results online.    REVIEW OF SYSTEMS: Denies night time disturbances. Denies fevers, chills, or night sweats. Denies unexplained weight loss. Denies personal history of cancer. Denies changes in bowel or bladder habits. Denies  recent unreported falls. Denies new or worsening dyspnea or wheezing. Denies headaches or dizziness.  Reports numbness, tingling or weakness in the extremities.  Denies dizziness or presyncopal episodes Reports lower extremity edema    HISTORY:  Prior history reviewed and updated per electronic medical record.  Social History   Occupational History  . Not on file  Tobacco Use  . Smoking status: Never Smoker  . Smokeless tobacco: Never Used  Substance and Sexual Activity  . Alcohol use: Yes    Alcohol/week: 0.0 standard drinks    Comment: social  . Drug use: Not on file  . Sexual activity: Not on file   Social History   Social History Narrative   Single.   Highest level of education completed Bachelors Degree.   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   Enjoys reading, running, hiking, camping.    Past Medical History:  Diagnosis Date  . Chicken pox    No past surgical history on file. family history includes Cancer in her maternal grandmother; Hypertension in her maternal grandmother and mother; Mental retardation in her mother.  DATA OBTAINED & REVIEWED:   Recent Labs    04/29/18 1622  LABURIC 4.3   Problem  Pain of Foot   MRI on 04/23/2018 show significant intermetatarsal bursitis as well as a likely traumatic bursal swelling underneath the great toe at the MTP joint.  Additionally there is evidence of bony edema in the distal phalanxes of the first second third and trace only on the fourth rays.  I did call and confirm  this with radiology who agrees that this does not appear to be an atypical edema pattern.    .   OBJECTIVE:  VS:  HT:5' 4.5" (163.8 cm)   WT:130 lb (59 kg)  BMI:21.98    BP:110/72  HR:80bpm  TEMP: ( )  RESP:99 %   PHYSICAL EXAM: CONSTITUTIONAL: Well-developed, Well-nourished and In no acute distress PSYCHIATRIC: Alert & appropriately interactive. and Not depressed or anxious appearing. RESPIRATORY: No increased work of  breathing and Trachea Midline EYES: Pupils are equal., EOM intact without nystagmus. and No scleral icterus.  VASCULAR EXAM: Warm and well perfused Pulses: DP Pulses: Bilaterally normal and symmetric PT Pulses: Bilaterally normal and symmetric Edema: Joint associated swelling as per Orthopedic exam and No Pre-tibial edema NEURO: unremarkable  MSK Exam: Right foot  Well aligned foot with diffuse swelling of the entire forefoot and midfoot.  However with good improvement Slight pallor of the first second and third distal phalanxes, improved TTP over The entire midfoot especially medially.  Improved pain with metatarsal squeeze test.  Persistent pain over the distal phalanges of the first second third and minimally over the fourth.  No pain over the fifth.   RANGE OF MOTION & STRENGTH  Normal ankle dorsiflexion, plantarflexion, inversion and eversion.  Well-maintained strength.   SPECIALITY TESTING:  Minimal pain with metatarsal squeeze compared to before     ASSESSMENT   1. Right foot pain   2. Inflammation of foot joint     PLAN:  Pertinent additional documentation may be included in corresponding procedure notes, imaging studies, problem based documentation and patient instructions.  Procedures:  . None  Medications:  Meds ordered this encounter  Medications  . alendronate (FOSAMAX) 70 MG tablet    Sig: Take 1 tablet (70 mg total) by mouth every 7 (seven) days. Take with a full glass of water on an empty stomach.    Dispense:  12 tablet    Refill:  1   Discussion/Instructions: Pain of foot Trending towards improvement but still slow. Given underlying atypical pattern we will plan to start her on bisphosphonate therapy.  Continue with boot immobilization and minimize weightbearing including continuing with kneeling scooter.  We will follow-up in an additional 2 weeks.  Vitamin D and calcium recommended.  .   . Discussed red flag symptoms that warrant earlier  emergent evaluation and patient voices understanding. . Activity modifications and the importance of avoiding exacerbating activities (limiting pain to no more than a 4 / 10 during or following activity) recommended and discussed. . >50% of this 25 minutes minute visit spent in direct patient counseling and/or coordination of care. Discussion was focused on education regarding the in discussing the pathoetiology and anticipated clinical course of the above condition.  Follow-up:  . No follow-ups on file.  . If any lack of improvement consider: possible treatment for CRPS     CMA/ATC served as scribe during this visit. History, Physical, and Plan performed by medical provider. Documentation and orders reviewed and attested to.      Andrena Mews, DO    [facility] Physician

## 2018-05-10 NOTE — Assessment & Plan Note (Signed)
Trending towards improvement but still slow. Given underlying atypical pattern we will plan to start her on bisphosphonate therapy.  Continue with boot immobilization and minimize weightbearing including continuing with kneeling scooter.  We will follow-up in an additional 2 weeks.  Vitamin D and calcium recommended.

## 2018-05-10 NOTE — Patient Instructions (Signed)
Vitamin D3, between 3000 and 5000 units daily is typically safe to take. If you take it long term it is recommended to check your levels to ensure you remain in a therapeutic range. Be good to make sure you are getting up to 1500 mg of calcium per day.  One Tums has 500 mg of calcium carbonate which equals approximately 200 mg of dietary calcium. It would probably be good to take 2-4 tablets on a daily basis in addition to the vitamin D3.  It is okay to start back on the ibuprofen as needed.

## 2018-05-26 ENCOUNTER — Ambulatory Visit: Payer: BC Managed Care – PPO | Admitting: Sports Medicine

## 2018-05-31 ENCOUNTER — Encounter: Payer: Self-pay | Admitting: Sports Medicine

## 2018-05-31 ENCOUNTER — Ambulatory Visit: Payer: BC Managed Care – PPO | Admitting: Sports Medicine

## 2018-05-31 VITALS — BP 112/86 | HR 97 | Ht 64.5 in | Wt 129.6 lb

## 2018-05-31 DIAGNOSIS — G90521 Complex regional pain syndrome I of right lower limb: Secondary | ICD-10-CM | POA: Diagnosis not present

## 2018-05-31 DIAGNOSIS — M19079 Primary osteoarthritis, unspecified ankle and foot: Secondary | ICD-10-CM | POA: Diagnosis not present

## 2018-05-31 DIAGNOSIS — M79671 Pain in right foot: Secondary | ICD-10-CM | POA: Diagnosis not present

## 2018-05-31 MED ORDER — GABAPENTIN 100 MG PO CAPS: ORAL_CAPSULE | ORAL | 1 refills | Status: DC

## 2018-05-31 MED ORDER — DULOXETINE HCL 20 MG PO CPEP: 20.0000 mg | ORAL_CAPSULE | Freq: Every day | ORAL | 2 refills | Status: DC

## 2018-05-31 NOTE — Assessment & Plan Note (Signed)
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

## 2018-05-31 NOTE — Progress Notes (Signed)
Veverly FellsMichael D. Delorise Shinerigby, DO  [facility] [place] [phone]  Terry [name] - 27 y.o. female MRN [id number]  Date of birth: 02/26/1991  Visit Date:   PCP: Doreene Nestlark, Katherine K, NP   Referred by: Doreene Nestlark, Katherine K, NP   SUBJECTIVE:  Terry Alimma Bara is here for Follow-up (R foot pain)   HPI: Patient is here for 2-week follow-up of her right foot status post stress reaction with possibility of lying systemic response based on views findings on MRI.  She has been taking bisphosphonate therapy over the past 3 weeks and has had moderate improvement in her pain level.  She is also been in a scooter but was able to discontinue this late last week.  She has noticed that her foot is cold more frequently.  She currently reports her pain is mild with mild swelling that is persistent.  She denies any burning or numbness.  She does continue to have issues with weightbearing.  REVIEW OF SYSTEMS: Reports night time disturbances. Denies fevers, chills, or night sweats. Denies unexplained weight loss. Denies personal history of cancer. Denies changes in bowel or bladder habits. Denies recent unreported falls. Denies new or worsening dyspnea or wheezing. Denies headaches or dizziness.  Denies numbness, tingling or weakness  In the extremities.  Denies dizziness or presyncopal episodes Denies lower extremity edema    HISTORY:  Prior history reviewed and updated per electronic medical record.  Social History  Occupational History  . Not on file  Tobacco Use  . Smoking status: Never Smoker  . Smokeless tobacco: Never Used  Substance and Sexual Activity  . Alcohol use: Yes    Alcohol/week: 0.0 standard drinks    Comment: social  . Drug use: Not on file  . Sexual activity: Not on file   Social History  Social History Narrative   Single.   Highest level of education completed Bachelors Degree.   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   Enjoys reading,  running, hiking, camping.      DATA OBTAINED & REVIEWED:   Recent Labs    06/17/17 1547 04/29/18 1622  LABURIC  --  4.3  CALCIUM 9.0 9.6  AST 16 16  ALT 11 11    Problem  Pain of Foot   MRI on 04/23/2018 show significant intermetatarsal bursitis as well as a likely traumatic bursal swelling underneath the great toe at the MTP joint.  Additionally there is evidence of bony edema in the distal phalanxes of the first second third and trace only on the fourth rays.  I did call and confirm this with radiology who agrees that this does not appear to be an atypical edema pattern.     No specialty comments available.  OBJECTIVE:  VS:  HT:5' 4.5" (163.8 cm)   WT:129 lb 9.6 oz (58.8 kg)  BMI:21.91    BP:112/86  HR:97bpm  TEMP: ( )  RESP:98 %   PHYSICAL EXAM: CONSTITUTIONAL: Well-developed, Well-nourished and In no acute distress PSYCHIATRIC : Alert & appropriately interactive. and Not depressed or anxious appearing. RESPIRATORY : No increased work of breathing and Trachea Midline EYES : Pupils are equal., EOM intact without nystagmus. and No scleral icterus.  VASCULAR EXAM : Marked changes of the right lower foot with splotching as well as generalized dystrophic change.  Capillary refill is up to 8 seconds.  DP and PT pulses are palpable NEURO: unremarkable Normal associated myotomal distribution strength to manual muscle testing Generalized foot dysesthesia.  MSK Exam:  Right foot  Well aligned, no significant deformity. No overlying skin changes. Generalized tenderness across the entire foot focally between the first and second metatarsals in the webspace as well as just hard to the first MTP but the appreciable swelling that was there previously has seemingly resolved.  Marked pain with metatarsal squeeze test.  Intrinsic ankle range of motion and strength tactile mildly stiff immobilization. Ligamentously stable to Anterior testing      ASSESSMENT   1. 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 Right foot pain   3. Inflammation of foot joint      PLAN:  Pertinent additional documentation may be included in corresponding procedure notes, imaging studies, problem based documentation and patient instructions.  Procedures:  None  Medications:   Meds ordered this encounter  Medications  . DULoxetine (CYMBALTA) 20 MG capsule    Sig: Take 1 capsule (20 mg total) by mouth daily.    Dispense:  30 capsule    Refill:  2  . 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  Appropriate medications added today.  We will have her begin work pivot physical therapy plan to follow-up with her in 2 weeks.  We will have her transition postop shoe but she is allowed to return to the boot for comfort as needed. Referral placed To pivot physical therapy Discussed red flag symptoms that warrant earlier emergent evaluation and patient voices understanding. Activity modifications and the importance of avoiding exacerbating activities (limiting pain to no more than a 4 / 10 during or following activity) recommended and discussed. Discussed appropriate use of both heat and ice with the patient today.  >50% of this 25 minutes minute visit spent in direct patient counseling and/or coordination of care. Discussion was focused on education regarding the in discussing the pathoetiology and anticipated clinical course of the above condition.  Follow-up:  Return in about 2 weeks (around 06/14/2018) for repeat clinical exam.  If any lack of improvement: consider further diagnostic evaluation with Three-phase bone scan          Andrena Mews, DO    [facility]

## 2018-06-16 ENCOUNTER — Encounter: Payer: Self-pay | Admitting: Sports Medicine

## 2018-06-16 ENCOUNTER — Ambulatory Visit: Payer: BC Managed Care – PPO | Admitting: Sports Medicine

## 2018-06-16 VITALS — BP 108/74 | HR 70 | Ht 64.5 in | Wt 130.4 lb

## 2018-06-16 DIAGNOSIS — M79671 Pain in right foot: Secondary | ICD-10-CM | POA: Diagnosis not present

## 2018-06-16 DIAGNOSIS — M19079 Primary osteoarthritis, unspecified ankle and foot: Secondary | ICD-10-CM | POA: Diagnosis not present

## 2018-06-16 DIAGNOSIS — G90521 Complex regional pain syndrome I of right lower limb: Secondary | ICD-10-CM | POA: Diagnosis not present

## 2018-06-16 MED ORDER — DULOXETINE HCL 30 MG PO CPEP: 30.0000 mg | ORAL_CAPSULE | Freq: Two times a day (BID) | ORAL | 2 refills | Status: DC

## 2018-06-16 MED ORDER — GABAPENTIN 300 MG PO CAPS: 300.0000 mg | ORAL_CAPSULE | Freq: Three times a day (TID) | ORAL | 2 refills | Status: DC

## 2018-06-16 NOTE — Assessment & Plan Note (Signed)
Moderate improvement in symptoms.  Will actually titrate Cymbalta and gabapentin.  If any persistent ongoing symptoms could consider Trental for bone healing follow-up in 6 weeks to ensure clinical resolution.  Continue with physical therapy as tolerated.  Warm and cool water soaking discussed.

## 2018-06-16 NOTE — Progress Notes (Signed)
[name]. [name] at [facility] [phone]  Shaketa Serafin - 27 y.o. female MRN [id number]  Date of birth: 15-Oct-1990  Visit Date:   PCP: Doreene Nest, NP   Referred by: Doreene Nest, NP   SUBJECTIVE:  Chief Complaint  Patient presents with  . f/u R foot pain    Taking Gabapentin 100 mg and Cymbalta as well as Fosamax. Was referred to PT, has been to 5 sessions, reports improved movement.     HPI: Patient is here for 2-week follow-up of her right foot pain.  She reports improvements in her symptoms at night continues to have some tingling.  The dystrophic changes have improved but she continues have some coolness and coldness.  Working with physical therapy and doing well.  Having some limitations in her day-to-day activities at work and is interested in getting a note for this.  REVIEW OF SYSTEMS: Continues to have some tingling in lower extremities but otherwise denies any significant nighttime disturbances fevers, chills recent weight gain or weight loss.  No significant lower extremity edema.  The small amount of focal pain pain that she is mainly having is on the plantar aspect of her great toe however the majority of her pain is generalized  HISTORY:  Prior history reviewed and updated per electronic medical record.  Social History   Occupational History  . Not on file  Tobacco Use  . Smoking status: Never Smoker  . Smokeless tobacco: Never Used  Substance and Sexual Activity  . Alcohol use: Yes    Alcohol/week: 0.0 standard drinks    Comment: social  . Drug use: Not on file  . Sexual activity: Not on file   Social History   Social History Narrative   Single.   Highest level of education completed Bachelors Degree.   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   Enjoys reading, running, hiking, camping.      DATA OBTAINED & REVIEWED:  Recent Labs    06/17/17 1547 04/29/18 1622  LABURIC   --  4.3  CALCIUM 9.0 9.6  AST 16 16  ALT 11 11   Problem  Pain of Foot   MRI on 04/23/2018 show significant intermetatarsal bursitis as well as a likely traumatic bursal swelling underneath the great toe at the MTP joint.  Additionally there is evidence of bony edema in the distal phalanxes of the first second third and trace only on the fourth rays.  I did call and confirm this with radiology who agrees that this does not appear to be an atypical edema pattern.    No specialty comments available.  OBJECTIVE:  VS:  HT:5' 4.5" (163.8 cm)   WT:130 lb 6.4 oz (59.1 kg)  BMI:22.05    BP:108/74  HR:70bpm  TEMP: ( )  RESP:99 %   PHYSICAL EXAM: Adult female.  No acute distress.  Right lower extremity is dystrophic appearing and with manipulation does seem to actually worsen.  There is mottling of the skin, erythema and pallor speckling.  DP and PT pulses are 2+/4.  Good capillary refill of 2 to 3 seconds which is improved in the past.   ASSESSMENT   1. Complex regional pain syndrome type 1 of right lower extremity   2. Right foot pain   3. Inflammation of foot joint     PLAN:  Pertinent additional documentation may be included in corresponding procedure notes, imaging studies, problem based documentation and  patient instructions.  Procedures:  None  Medications:  Meds ordered this encounter  Medications  . DULoxetine (CYMBALTA) 30 MG capsule    Sig: Take 1 capsule (30 mg total) by mouth 2 (two) times daily.    Dispense:  60 capsule    Refill:  2  . gabapentin (NEURONTIN) 300 MG capsule    Sig: Take 1 capsule (300 mg total) by mouth 3 (three) times daily.    Dispense:  90 capsule    Refill:  2    Discussion/Instructions: Pain of foot Moderate improvement in symptoms.  Will actually titrate Cymbalta and gabapentin.  If any persistent ongoing symptoms could consider Trental for bone healing follow-up in 6 weeks to ensure clinical resolution.  Continue with physical therapy as  tolerated.  Warm and cool water soaking discussed.  THERAPEUTIC EXERCISE: Discussed the foundation of treatment for this condition is physical therapy and/or daily (5-6 days/week) therapeutic exercises, focusing on core strengthening, coordination, neuromuscular control/reeducation.  Continue previously prescribed home exercise program.  >50% of this 25 minutes minute visit spent in direct patient counseling and/or coordination of care. Discussion was focused on education regarding the in discussing the pathoetiology and anticipated clinical course of the above condition.  Return in about 6 weeks (around 07/28/2018).          Andrena MewsMichael D Rigby, DO    [facility] Physician

## 2018-07-29 ENCOUNTER — Ambulatory Visit: Payer: BC Managed Care – PPO | Admitting: Sports Medicine

## 2018-07-29 ENCOUNTER — Encounter: Payer: Self-pay | Admitting: Sports Medicine

## 2018-07-29 VITALS — BP 106/68 | HR 79 | Ht 64.5 in | Wt 129.8 lb

## 2018-07-29 DIAGNOSIS — M19079 Primary osteoarthritis, unspecified ankle and foot: Secondary | ICD-10-CM

## 2018-07-29 DIAGNOSIS — G90521 Complex regional pain syndrome I of right lower limb: Secondary | ICD-10-CM | POA: Diagnosis not present

## 2018-07-29 DIAGNOSIS — M79671 Pain in right foot: Secondary | ICD-10-CM | POA: Diagnosis not present

## 2018-07-29 NOTE — Patient Instructions (Addendum)
The cost of the pair of custom orthotics is $195.  You can look into having your insurance company cover the cost of these. Some insurance companies cover the cost and other do not.  If they do not you will be responsible for the full cost of the orthotics.  I am happy to do these for you at any time, you just need to let our front office schedulers know you would like an "orthotic appointment."  Please also make sure you bring athletic shoes with you on the day of your orthotic appointment or whatever shoes you plan to wear your orthotics in most frequently.   When you call your insurance company you will need to provide them the CPT code which is L3030 and there are 2 units.  You can call them  and ask if this is covered.        

## 2018-08-01 ENCOUNTER — Encounter: Payer: Self-pay | Admitting: Sports Medicine

## 2018-08-01 NOTE — Progress Notes (Signed)
Terry [name]. [name] at [facility] [phone]  Terry [name] - 28 y.o. female MRN [id number]  Date of birth: [birth year]  Visit Date: 07/29/2018  PCP: Doreene Nest, NP   Referred by: Doreene Nest, NP  SUBJECTIVE:   Chief Complaint  Patient presents with  . Right Foot - Follow-up    XR R foot 03/30/18, 04/14/18. MRI R foot 04/23/2018. Taking Cymbalta and Gabapentin. Has been referred to PT.     HPI: Patient reports she is doing overall significantly better and continues to show steady improvement.  She is having mild aching pain that is randomly shooting pain.  She has been increasing her activities and physical therapy and has progressed to being able to jog for a minute and walk for 3.  She is not have any significant exacerbations with this.  She does continue to note some dystrophic changes.  The great toe is still a focal source of her pain when exacerbated but day-to-day no longer bothersome.  REVIEW OF SYSTEMS: Denies fevers, chills, recent weight gain or weight loss.  No night sweats. No significant nighttime awakenings due to this issue. Pt denies any change in bowel or bladder habits, muscle weakness, numbness or falls associated with this pain. Otherwise 12 point review of systems performed and is negative   HISTORY:  Prior history reviewed and updated per electronic medical record.  Patient Active Problem List   Diagnosis Date Noted  . Pain of foot 03/31/2018    MRI on 04/23/2018 show significant intermetatarsal bursitis as well as a likely traumatic bursal swelling underneath the great toe at the MTP joint.  Additionally there is evidence of bony edema in the distal phalanxes of the first second third and trace only on the fourth rays.  I did call and confirm this with radiology who agrees that this does not appear to be an atypical edema pattern.   . Preventative health care 02/07/2015   Social History    Occupational History  . Not on file  Tobacco Use  . Smoking status: Never Smoker  . Smokeless tobacco: Never Used  Substance and Sexual Activity  . Alcohol use: Yes    Alcohol/week: 0.0 standard drinks    Comment: social  . Drug use: Not on file  . Sexual activity: Not on file   Social History   Social History Narrative   Single.   Highest level of education completed Bachelors Degree.   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   Enjoys reading, running, hiking, camping.    OBJECTIVE:  VS:  HT:5' 4.5" (163.8 cm)   WT:129 lb 12.8 oz (58.9 kg)  BMI:21.94    BP:106/68  HR:79bpm  TEMP: ( )  RESP:98 %   PHYSICAL EXAM: Adult female. No acute distress.  Alert and appropriate. Right foot and ankle are overall improved appearing with no significant dystrophic changes at rest however with gentle manipulation exam including forefoot abduction and an intrinsic ankle strength testing she does start to have blanching and skin discolorations.  There is moderate amount of hyperhidrosis.  No significant hyperesthesia.   ASSESSMENT:   1. Complex regional pain syndrome type 1 of right lower extremity   2. Right foot pain   3. Inflammation of foot joint     PROCEDURES:  None  PLAN:  Pertinent additional documentation may be included in corresponding procedure notes, imaging studies, problem based documentation and patient instructions.  No problem-specific Assessment &  Plan notes found for this encounter.   She is doing remarkably better and continues to improve.  Cymbalta has been the most beneficial.  We did discuss the option of trying to wean off of the gabapentin she is not noticed that she is needing day-to-day control of her symptoms.  If she is able to do this I am happy to have her completely discontinue gabapentin over the next 2 weeks.  We can consider titrating her Cymbalta up if she has any issues in doing so to try to move to monotherapy treatment.  Continue  previously prescribed home exercise program.  and we did discuss appropriate return to running activities which she does understand this will likely take an additional 6 to 12 months before she can fully return to activities as she wishes  Activity modifications and the importance of avoiding exacerbating activities (limiting pain to no more than a 4 / 10 during or following activity) recommended and discussed.  Discussed red flag symptoms that warrant earlier emergent evaluation and patient voices understanding.  We did discuss custom cushion insoles that would likely be beneficial for helping add additional cushioning and forefoot/hindfoot control.  No orders of the defined types were placed in this encounter.  Lab Orders  No laboratory test(s) ordered today   Imaging Orders  No imaging studies ordered today   Referral Orders  No referral(s) requested today    Return in about 3 months (around 10/28/2018) for orthotics visit at your convenience.          Andrena Mews, DO    [facility] Physician

## 2018-08-11 ENCOUNTER — Ambulatory Visit: Payer: BC Managed Care – PPO | Admitting: Sports Medicine

## 2018-08-18 ENCOUNTER — Ambulatory Visit (INDEPENDENT_AMBULATORY_CARE_PROVIDER_SITE_OTHER): Payer: BC Managed Care – PPO | Admitting: Sports Medicine

## 2018-08-18 ENCOUNTER — Encounter: Payer: Self-pay | Admitting: Sports Medicine

## 2018-08-18 DIAGNOSIS — M79671 Pain in right foot: Secondary | ICD-10-CM

## 2018-08-18 DIAGNOSIS — G90521 Complex regional pain syndrome I of right lower limb: Secondary | ICD-10-CM | POA: Diagnosis not present

## 2018-08-18 DIAGNOSIS — R269 Unspecified abnormalities of gait and mobility: Secondary | ICD-10-CM | POA: Diagnosis not present

## 2018-08-18 NOTE — Progress Notes (Signed)
  [name]. [name] [facility] at [facility] [phone]  Jeilany Nitsch - 28 y.o. female MRN [id number]  Date of birth: 1991-03-29  Visit Date:   PCP: Doreene Nest, NP   Referred by: Doreene Nest, NP  SUBJECTIVE:  Calina Sutera is here for a procedure only visit for custom orthotic fabrication.  OBJECTIVE:  PHYSICAL EXAM: Please see previous exam notes for full evaluation of foot and gait exam. MSK Exam: Slight dystrophic changes on the foot.  Loss of transverse arch and longitudinal arch.  Slight antalgic gait.  ASSESSMENT   1. Complex regional pain syndrome type 1 of right lower extremity   2. Right foot pain   3. Gait disturbance       PLAN & PROCEDURES:   Custom orthotics fabricated today as below     PROCEDURE: CUSTOM ORTHOTIC FABRICATION Patient's underlying musculoskeletal conditions are directly related to poor biomechanics and will benefit from a functional custom orthotic.  There are no significant foot deformities that complicate the use of a custom orthotic.  The patient was fitted for a standard, cushioned, semi-rigid orthotic. The orthotic was heated & placed on the orthotic stand. The patient was positioned in subtalar neutral position and 10 of ankle dorsiflexion and weight bearing stance on the heated orthotic blank. After completion of the molding a base was applied to the orthotic blank. The orthotic was ground to a stable position for weightbearing. The patient ambulated in these and reported they were comfortable without pressure spots.              BLANK:  Size 7 - Standard Cushioned               BASE:  Blue EVA      POSTINGS:  none          Andrena Mews, DO    [facility] Physician

## 2018-09-21 ENCOUNTER — Other Ambulatory Visit: Payer: Self-pay | Admitting: Sports Medicine

## 2018-10-14 ENCOUNTER — Other Ambulatory Visit: Payer: Self-pay | Admitting: Sports Medicine

## 2018-10-14 NOTE — Telephone Encounter (Signed)
Last OV 08/18/2018 Last refill 06/16/2018 #90/2 Next OV 10/28/2018

## 2018-10-15 ENCOUNTER — Other Ambulatory Visit: Payer: Self-pay | Admitting: Sports Medicine

## 2018-10-27 ENCOUNTER — Ambulatory Visit: Payer: BC Managed Care – PPO | Admitting: Sports Medicine

## 2018-10-28 ENCOUNTER — Ambulatory Visit: Payer: BC Managed Care – PPO | Admitting: Sports Medicine

## 2018-11-02 ENCOUNTER — Other Ambulatory Visit: Payer: Self-pay

## 2018-11-02 ENCOUNTER — Ambulatory Visit: Payer: Self-pay

## 2018-11-02 ENCOUNTER — Ambulatory Visit: Payer: BC Managed Care – PPO | Admitting: Family Medicine

## 2018-11-02 VITALS — BP 112/74 | HR 90 | Ht 64.5 in | Wt 136.0 lb

## 2018-11-02 DIAGNOSIS — M79673 Pain in unspecified foot: Secondary | ICD-10-CM

## 2018-11-02 DIAGNOSIS — M79674 Pain in right toe(s): Secondary | ICD-10-CM

## 2018-11-02 DIAGNOSIS — M216X1 Other acquired deformities of right foot: Secondary | ICD-10-CM

## 2018-11-02 MED ORDER — DICLOFENAC SODIUM 2 % TD SOLN: 2.0000 g | Freq: Two times a day (BID) | TRANSDERMAL | 3 refills | Status: AC

## 2018-11-02 NOTE — Progress Notes (Signed)
[facility] [address] [place], [place] [address] Phone: [phone] Subjective:   Terry [name], am serving as a scribe for Dr. Antoine Primas.    CC: foot pain follow up   [id number]  Terry [name] is a 28 y.o. female coming in with complaint of right great toe pain. Patient was told that she has complex regional pain syndrome.  She was immobilized in Sept 2019 due to pain. Pain continues to occur intermittently. Pain occurs under the great toe over the distal phalanx. Has had custom orthotics since February which have helped her pain. Trying to get back to running half marathon. Has been taking gabapentin and cymbalta since December 2019.      Past Medical History:  Diagnosis Date  . Chicken pox    No past surgical history on file. Social History   Socioeconomic History  . Marital status: Single    Spouse name: Not on file  . Number of children: Not on file  . Years of education: Not on file  . Highest education level: Not on file  Occupational History  . Not on file  Social Needs  . Financial resource strain: Not on file  . Food insecurity:    Worry: Not on file    Inability: Not on file  . Transportation needs:    Medical: Not on file    Non-medical: Not on file  Tobacco Use  . Smoking status: Never Smoker  . Smokeless tobacco: Never Used  Substance and Sexual Activity  . Alcohol use: Yes    Alcohol/week: 0.0 standard drinks    Comment: social  . Drug use: Not on file  . Sexual activity: Not on file  Lifestyle  . Physical activity:    Days per week: Not on file    Minutes per session: Not on file  . Stress: Not on file  Relationships  . Social connections:    Talks on phone: Not on file    Gets together: Not on file    Attends religious service: Not on file    Active member of club or organization: Not on file    Attends meetings of clubs or organizations: Not on file    Relationship status: Not on file  Other Topics  Concern  . Not on file  Social History Narrative   Single.   Highest level of education completed Bachelors Degree.   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   Enjoys reading, running, hiking, camping.   No Known Allergies Family History  Problem Relation Age of Onset  . Hypertension Mother   . Mental retardation Mother   . Cancer Maternal Grandmother        breast  . Hypertension Maternal Grandmother          Current Outpatient Medications (Other):  Marland Kitchen  DULoxetine (CYMBALTA) 30 MG capsule, TAKE ONE CAPSULE BY MOUTH TWICE A DAY .  gabapentin (NEURONTIN) 300 MG capsule, TAKE ONE CAPSULE BY MOUTH THREE TIMES A DAY .  Diclofenac Sodium 2 % SOLN, Place 2 g onto the skin 2 (two) times daily.    Past medical history, social, surgical and family history all reviewed in electronic medical record.  No pertanent information unless stated regarding to the chief complaint.   Review of Systems:  No headache, visual changes, nausea, vomiting, diarrhea, constipation, dizziness, abdominal pain, skin rash, fevers, chills, night sweats, weight loss, swollen lymph nodes, body aches, joint swelling,  chest pain, shortness  of breath, mood changes.  Positive muscle aches  Objective  Blood pressure 112/74, pulse 90, height 5' 4.5" (1.638 m), weight 136 lb (61.7 kg), SpO2 99 %.    General: No apparent distress alert and oriented x3 mood and affect normal, dressed appropriately.  HEENT: Pupils equal, extraocular movements intact  Respiratory: Patient's speak in full sentences and does not appear short of breath  Cardiovascular: No lower extremity edema, non tender, no erythema  Skin: Warm dry intact with no signs of infection or rash on extremities or on axial skeleton.  Abdomen: Soft nontender  Neuro: Cranial nerves II through XII are intact, neurovascularly intact in all extremities with 2+ DTRs and 2+ pulses.  Lymph: No lymphadenopathy of posterior or anterior cervical chain or axillae  bilaterally.  Gait normal with good balance and coordination.  MSK:  Non tender with full range of motion and good stability and symmetric strength and tone of shoulders, elbows, wrist, hip, knee and ankles bilaterally.  Patient's right foot exam shows mild chronic equinus.  Does have tightness of the posterior capsule of the foot.  Significant breakdown the transverse arch with splaying between the first and second toes.  Patient has some mild tenderness to palpation over the metatarsal heads.  Mild positive squeeze test.  Midfoot is somewhat rigid for patient's age.  Limited musculoskeletal ultrasound was performed interpreted by Judi SaaZachary M   Limited ultrasound of patient's foot shows the patient does still have a synovitis of the second third and fourth tarsals.  No true cortical defect noted.  No neuromas noted at this moment.  Patient does have some very mild surrounding bursitis as well around the metatarsal heads.    Impression and Recommendations:     This case required medical decision making of moderate complexity. The above documentation has been reviewed and is accurate and complete Judi SaaZachary M , DO       Note: This dictation was prepared with Dragon dictation along with smaller phrase technology. Any transcriptional errors that result from this process are unintentional.

## 2018-11-02 NOTE — Patient Instructions (Addendum)
Good to see you.  pennsaid pinkie amount topically 2 times daily as needed.  Avoid being barefoot.  Try the new orthotic as well  Look into cinnamon for possible raynaud's Consider always running in compression socks or look at [email or web address] for X-linked ankle brace Lets drop the gabapentin to 200mg  at night for 1-2 weeks- if no change then drop to 100mg  for 1-2 weeks, then stop  Continue the cymbalta for nw and then we will hopefully get you off in 6 weeks See me again in 6 weeks

## 2018-11-03 DIAGNOSIS — M216X9 Other acquired deformities of unspecified foot: Secondary | ICD-10-CM | POA: Insufficient documentation

## 2018-11-03 MED ORDER — GABAPENTIN 100 MG PO CAPS: 200.0000 mg | ORAL_CAPSULE | Freq: Every day | ORAL | 3 refills | Status: DC

## 2018-11-03 NOTE — Assessment & Plan Note (Signed)
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  We discussed a metatarsal padding added to patient's custom orthotics.  We discussed trying to avoid being barefoot, topical anti-inflammatories given, and at this moment we will continue the Cymbalta but I am more pessimistic at the diagnosis of a complex regional pain syndrome.  Questionable raynaud's is within the differential though.  Encourage patient to decrease gabapentin and we will decrease 100 mg per night at weekly until patient has been off of it.  And see how patient responds.  Patient does relatively well then we will do the same thing in 4 to 6 weeks to the Cymbalta.

## 2018-11-21 ENCOUNTER — Other Ambulatory Visit: Payer: Self-pay | Admitting: Sports Medicine

## 2018-11-23 ENCOUNTER — Encounter: Payer: Self-pay | Admitting: Family Medicine

## 2018-11-23 MED ORDER — DULOXETINE HCL 30 MG PO CPEP: 30.0000 mg | ORAL_CAPSULE | Freq: Two times a day (BID) | ORAL | 1 refills | Status: DC

## 2018-12-14 ENCOUNTER — Other Ambulatory Visit: Payer: Self-pay

## 2018-12-14 ENCOUNTER — Encounter: Payer: Self-pay | Admitting: Family Medicine

## 2018-12-14 ENCOUNTER — Ambulatory Visit: Payer: Self-pay

## 2018-12-14 ENCOUNTER — Ambulatory Visit: Payer: BC Managed Care – PPO | Admitting: Family Medicine

## 2018-12-14 VITALS — BP 106/68 | HR 80 | Ht 64.5 in | Wt 140.0 lb

## 2018-12-14 DIAGNOSIS — M79674 Pain in right toe(s): Secondary | ICD-10-CM

## 2018-12-14 DIAGNOSIS — M79673 Pain in unspecified foot: Secondary | ICD-10-CM | POA: Diagnosis not present

## 2018-12-14 NOTE — Progress Notes (Signed)
[name] [address] [place], [place] [address] Phone: [phone] Subjective:   Terry Terry [name], am serving as a scribe for Dr. Hulan Saas.  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  We discussed a metatarsal padding added to patient's custom orthotics.  We discussed trying to avoid being barefoot, topical anti-inflammatories given, and at this moment we will continue the Cymbalta but I am more pessimistic at the diagnosis of a complex regional pain syndrome.  Questionable raynaud's is within the differential though.  Encourage patient to decrease gabapentin and we will decrease 100 mg per night at weekly until patient has been off of it.  And see how patient responds.  Patient does relatively well then we will do the same thing in 4 to 6 weeks to the Cymbalta.  Terry Terry [name] is a 28 y.o. female coming in with complaint of great toe pain. Has felt some improvement since last visit. Has been tapering off gabapentin and is off of medication now for 2 weeks. Pain is intermittent and can depend on the day.  States overall 80% better.     Past Medical History:  Diagnosis Date  . Chicken pox    Terry [name] past surgical history on file. Social History   Socioeconomic History  . Marital status: Single    Spouse name: Not on file  . Number of children: Not on file  . Years of education: Not on file  . Highest education level: Not on file  Occupational History  . Not on file  Social Needs  . Financial resource strain: Not on file  . Food insecurity    Worry: Not on file    Inability: Not on file  . Transportation needs    Medical: Not on file    Non-medical: Not on file  Tobacco Use  . Smoking status: Never Smoker  . Smokeless tobacco: Never Used  Substance and Sexual  Activity  . Alcohol use: Yes    Alcohol/week: 0.0 standard drinks    Comment: social  . Drug use: Not on file  . Sexual activity: Not on file  Lifestyle  . Physical activity    Days per week: Not on file    Minutes per session: Not on file  . Stress: Not on file  Relationships  . Social Herbalist on phone: Not on file    Gets together: Not on file    Attends religious service: Not on file    Active member of club or organization: Not on file    Attends meetings of clubs or organizations: Not on file    Relationship status: Not on file  Other Topics Concern  . Not on file  Social History Narrative   Single.   Highest level of education completed Bachelors Degree.   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   Enjoys reading, running, hiking, camping.   Terry [name] Known Allergies Family History  Problem Relation Age of Onset  . Hypertension Mother   . Mental retardation Mother   . Cancer Maternal Grandmother        breast  . Hypertension Maternal Grandmother          Current Outpatient Medications (Other):  Marland Kitchen  Diclofenac Sodium 2 %  SOLN, Place 2 g onto the skin 2 (two) times daily. .  DULoxetine (CYMBALTA) 30 MG capsule, Take 1 capsule (30 mg total) by mouth 2 (two) times daily.    Past medical history, social, surgical and family history all reviewed in electronic medical record.  Terry [name] pertanent information unless stated regarding to the chief complaint.   Review of Systems:  Terry [name] headache, visual changes, nausea, vomiting, diarrhea, constipation, dizziness, abdominal pain, skin rash, fevers, chills, night sweats, weight loss, swollen lymph nodes, body aches, joint swelling, muscle aches, chest pain, shortness of breath, mood changes.   Objective  Blood pressure 106/68, pulse 80, height 5' 4.5" (1.638 Terry [name]), weight 140 lb (63.5 kg), SpO2 99 %.    General: Terry [name] apparent distress alert and oriented x3 mood and affect normal, dressed appropriately.  HEENT: Pupils  equal, extraocular movements intact  Respiratory: Patient's speak in full sentences and does not appear short of breath  Cardiovascular: Terry [name] lower extremity edema, non tender, Terry [name] erythema  Skin: Warm dry intact with Terry [name] signs of infection or rash on extremities or on axial skeleton.  Abdomen: Soft nontender  Neuro: Cranial nerves II through XII are intact, neurovascularly intact in all extremities with 2+ DTRs and 2+ pulses.  Lymph: Terry [name] lymphadenopathy of posterior or anterior cervical chain or axillae bilaterally.  Gait normal with good balance and coordination.  MSK:  Non tender with full range of motion and good stability and symmetric strength and tone of shoulders, elbows, wrist, hip, knee and ankles bilaterally.    foot exam shows patient still has some mild hallux rigidus patient mild tenderness over the first metatarsal but significant improvement.  Patient has Terry [name] swelling of the foot.  Terry [name] redness Terry [name] erythema.  Pain is the same temperature as the contralateral side  Limited musculoskeletal ultrasound was performed and interpreted by Terry Terry [name]   Limited ultrasound shows the patient does still have some mild swelling of the capsule of the first metatarsal but a significant improvement from previous exam.  Terry [name] cortical irregularities noted.  Terry [name] abnormal vascularity Impression: Interval improvement of her capsulitis and pain in the foot  Impression and Recommendations:     This case required medical decision making of moderate complexity. The above documentation has been reviewed and is accurate and complete Terry Terry [name] , DO       Note: This dictation was prepared with Dragon dictation along with smaller phrase technology. Any transcriptional errors that result from this process are unintentional.

## 2018-12-14 NOTE — Assessment & Plan Note (Signed)
Patient is doing well overall.  We discussed decreasing the Cymbalta at the moment.  Continue with the same regimen with the orthotics, icing regimen, vitamin D.  Follow-up again in 6 weeks

## 2018-12-14 NOTE — Patient Instructions (Signed)
Continue what we are doing Toe looks great Recovery sandals in house HOKA or OOFOS See me in 5-6 weeks if no better

## 2018-12-22 ENCOUNTER — Encounter: Payer: Self-pay | Admitting: Family Medicine

## 2019-01-12 ENCOUNTER — Encounter: Payer: Self-pay | Admitting: Family Medicine

## 2019-01-18 ENCOUNTER — Ambulatory Visit: Payer: BC Managed Care – PPO | Admitting: Family Medicine

## 2019-02-14 ENCOUNTER — Other Ambulatory Visit: Payer: Self-pay

## 2019-02-14 ENCOUNTER — Encounter: Payer: Self-pay | Admitting: Primary Care

## 2019-02-14 ENCOUNTER — Telehealth (INDEPENDENT_AMBULATORY_CARE_PROVIDER_SITE_OTHER): Payer: BC Managed Care – PPO | Admitting: Primary Care

## 2019-02-14 VITALS — Wt 140.0 lb

## 2019-02-14 DIAGNOSIS — F419 Anxiety disorder, unspecified: Secondary | ICD-10-CM

## 2019-02-14 HISTORY — DX: Anxiety disorder, unspecified: F41.9

## 2019-02-14 NOTE — Patient Instructions (Signed)
You will be contacted regarding your referral to therapy.  Please let us know if you have not been contacted within one week.   Continue to write in your journal, exercise, meditate to decrease anxiety/stress.  Try melatonin 5 mg one hour prior to bedtime as discussed. Do not exceed 10 mg in 24 hours.  It was a pleasure to see you today! Allie Bossier, NP-C

## 2019-02-14 NOTE — Assessment & Plan Note (Signed)
Acute anxiety in regards to Covid-19 and returning to the classroom. Doesn't struggle with generalized anxiety disorder previously.  Discussed options for treating including self calming techniques that she's already used, also therapy for which she is interested. Referral placed for therapy. Discussed use of Melatonin to use as needed for sleep. She will follow up as needed. Stable for outpatient treatment.

## 2019-02-14 NOTE — Progress Notes (Signed)
Subjective:    Patient ID: Terry [name], female    DOB: [birth year], 28 y.o.   MRN: [id number]  HPI  Virtual Visit via Video Note  I connected with Terry [name] on 02/14/19 at  7:20 AM EDT by a video enabled telemedicine application and verified that I am speaking with the correct person using two identifiers.  Location: Patient: Home Provider: Office   I discussed the limitations of evaluation and management by telemedicine and the availability of in person appointments. The patient expressed understanding and agreed to proceed.  History of Present Illness:  Terry [name] is a 28 year old female who presents today with a chief complaint of anxiety.  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 These other adults have outside jobs and this concerns her in terms of spreading Covid-19. Her occupation is "requiring masks" but aren't mandating. She also questions the cleanliness of her building and classroom.   Symptoms include difficulty sleeping, feeling anxious. Symptoms began about one month ago increased over the last week after returning to the classroom. She's been running a lot, meditating, and writing in her journal to help with symptoms over the last month, this has helped. She has not tried anything OTC for sleep.    Observations/Objective:  Alert and oriented. Appears well, not sickly. No distress. Speaking in complete sentences.   Assessment and Plan:  Acute anxiety in regards to Covid-19 and returning to the classroom. Doesn't struggle with generalized anxiety disorder previously.  Discussed options for treating including self calming techniques that she's already used, also therapy for which she is interested. Referral placed for therapy. Discussed use of Melatonin to use as needed for sleep. She will follow up as needed. Stable for outpatient treatment.  Follow Up Instructions:  You will be contacted  regarding your referral to therapy.  Please let us know if you have not been contacted within one week.   Continue to write in your journal, exercise, meditate to decrease anxiety/stress.  Try melatonin 5 mg one hour prior to bedtime as discussed. Do not exceed 10 mg in 24 hours.  It was a pleasure to see you today! Allie Bossier, NP-C    I discussed the assessment and treatment plan with the patient. The patient was provided an opportunity to ask questions and all were answered. The patient agreed with the plan and demonstrated an understanding of the instructions.   The patient was advised to call back or seek an in-person evaluation if the symptoms worsen or if the condition fails to improve as anticipated.    Pleas Koch, NP    Review of Systems  Respiratory: Negative for shortness of breath.   Cardiovascular: Negative for chest pain.  Psychiatric/Behavioral: Positive for sleep disturbance. The patient is nervous/anxious.        See HPI       Past Medical History:  Diagnosis Date  . Chicken pox      Social History   Socioeconomic History  . Marital status: Single    Spouse name: Not on file  . Number of children: Not on file  . Years of education: Not on file  . Highest education level: Not on file  Occupational History  . Not on file  Social Needs  . Financial resource strain: Not on file  . Food insecurity    Worry: Not on file    Inability: Not on file  .  Transportation needs    Medical: Not on file    Non-medical: Not on file  Tobacco Use  . Smoking status: Never Smoker  . Smokeless tobacco: Never Used  Substance and Sexual Activity  . Alcohol use: Yes    Alcohol/week: 0.0 standard drinks    Comment: social  . Drug use: Not on file  . Sexual activity: Not on file  Lifestyle  . Physical activity    Days per week: Not on file    Minutes per session: Not on file  . Stress: Not on file  Relationships  . Social Musicianconnections    Talks on phone:  Not on file    Gets together: Not on file    Attends religious service: Not on file    Active member of club or organization: Not on file    Attends meetings of clubs or organizations: Not on file    Relationship status: Not on file  . Intimate partner violence    Fear of current or ex partner: Not on file    Emotionally abused: Not on file    Physically abused: Not on file    Forced sexual activity: Not on file  Other Topics Concern  . Not on file  Social History Narrative   Single.   Highest level of education completed Bachelors Degree.   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   Enjoys reading, running, hiking, camping.    No past surgical history on file.  Family History  Problem Relation Age of Onset  . Hypertension Mother   . Mental retardation Mother   . Cancer Maternal Grandmother        breast  . Hypertension Maternal Grandmother     No Known Allergies  Current Outpatient Medications on File Prior to Visit  Medication Sig Dispense Refill  . Diclofenac Sodium 2 % SOLN Place 2 g onto the skin 2 (two) times daily. 112 g 3   No current facility-administered medications on file prior to visit.     Wt 140 lb (63.5 kg)   LMP 01/24/2019   BMI 23.66 kg/m    Objective:   Physical Exam  Constitutional: She is oriented to person, place, and time. She appears well-nourished.  Respiratory: Effort normal.  Neurological: She is alert and oriented to person, place, and time.  Psychiatric: She has a normal mood and affect.           Assessment & Plan:

## 2019-03-14 ENCOUNTER — Ambulatory Visit (INDEPENDENT_AMBULATORY_CARE_PROVIDER_SITE_OTHER): Payer: BC Managed Care – PPO | Admitting: Psychology

## 2019-03-14 DIAGNOSIS — F4322 Adjustment disorder with anxiety: Secondary | ICD-10-CM

## 2019-04-04 ENCOUNTER — Ambulatory Visit (INDEPENDENT_AMBULATORY_CARE_PROVIDER_SITE_OTHER): Payer: BC Managed Care – PPO | Admitting: Psychology

## 2019-04-04 DIAGNOSIS — F4322 Adjustment disorder with anxiety: Secondary | ICD-10-CM

## 2019-04-14 ENCOUNTER — Ambulatory Visit (INDEPENDENT_AMBULATORY_CARE_PROVIDER_SITE_OTHER): Payer: BC Managed Care – PPO

## 2019-04-14 DIAGNOSIS — Z23 Encounter for immunization: Secondary | ICD-10-CM

## 2019-04-19 ENCOUNTER — Ambulatory Visit (INDEPENDENT_AMBULATORY_CARE_PROVIDER_SITE_OTHER): Payer: BC Managed Care – PPO | Admitting: Psychology

## 2019-04-19 DIAGNOSIS — F4322 Adjustment disorder with anxiety: Secondary | ICD-10-CM | POA: Diagnosis not present

## 2019-05-03 ENCOUNTER — Ambulatory Visit (INDEPENDENT_AMBULATORY_CARE_PROVIDER_SITE_OTHER): Payer: BC Managed Care – PPO | Admitting: Psychology

## 2019-05-03 DIAGNOSIS — F4322 Adjustment disorder with anxiety: Secondary | ICD-10-CM

## 2019-05-17 ENCOUNTER — Ambulatory Visit (INDEPENDENT_AMBULATORY_CARE_PROVIDER_SITE_OTHER): Payer: BC Managed Care – PPO | Admitting: Psychology

## 2019-05-17 DIAGNOSIS — F4322 Adjustment disorder with anxiety: Secondary | ICD-10-CM | POA: Diagnosis not present

## 2019-06-02 ENCOUNTER — Ambulatory Visit (INDEPENDENT_AMBULATORY_CARE_PROVIDER_SITE_OTHER): Payer: BC Managed Care – PPO | Admitting: Psychology

## 2019-06-02 DIAGNOSIS — F4322 Adjustment disorder with anxiety: Secondary | ICD-10-CM

## 2019-06-16 ENCOUNTER — Ambulatory Visit (INDEPENDENT_AMBULATORY_CARE_PROVIDER_SITE_OTHER): Payer: BC Managed Care – PPO | Admitting: Psychology

## 2019-06-16 DIAGNOSIS — F4322 Adjustment disorder with anxiety: Secondary | ICD-10-CM | POA: Diagnosis not present

## 2019-07-04 ENCOUNTER — Ambulatory Visit (INDEPENDENT_AMBULATORY_CARE_PROVIDER_SITE_OTHER): Payer: BC Managed Care – PPO | Admitting: Psychology

## 2019-07-04 DIAGNOSIS — F4322 Adjustment disorder with anxiety: Secondary | ICD-10-CM | POA: Diagnosis not present

## 2019-07-21 ENCOUNTER — Ambulatory Visit (INDEPENDENT_AMBULATORY_CARE_PROVIDER_SITE_OTHER): Payer: BC Managed Care – PPO | Admitting: Psychology

## 2019-07-21 DIAGNOSIS — F4322 Adjustment disorder with anxiety: Secondary | ICD-10-CM | POA: Diagnosis not present

## 2019-08-09 ENCOUNTER — Ambulatory Visit (INDEPENDENT_AMBULATORY_CARE_PROVIDER_SITE_OTHER): Payer: BC Managed Care – PPO | Admitting: Psychology

## 2019-08-09 DIAGNOSIS — F4322 Adjustment disorder with anxiety: Secondary | ICD-10-CM | POA: Diagnosis not present

## 2019-08-24 ENCOUNTER — Ambulatory Visit (INDEPENDENT_AMBULATORY_CARE_PROVIDER_SITE_OTHER): Payer: BC Managed Care – PPO | Admitting: Psychology

## 2019-08-24 DIAGNOSIS — F4322 Adjustment disorder with anxiety: Secondary | ICD-10-CM

## 2019-08-27 ENCOUNTER — Ambulatory Visit: Payer: BC Managed Care – PPO | Attending: Internal Medicine

## 2019-08-27 DIAGNOSIS — Z23 Encounter for immunization: Secondary | ICD-10-CM

## 2019-08-27 NOTE — Progress Notes (Signed)
   Covid-19 Vaccination Clinic  Name:  Tyaira Heward    MRN: [id number] DOB: 01/11/1991  08/27/2019  Ms. Paternostro was observed post Covid-19 immunization for 15 minutes without incidence. She was provided with Vaccine Information Sheet and instruction to access the V-Safe system.   Ms. Markwardt was instructed to call 911 with any severe reactions post vaccine: Marland Kitchen Difficulty breathing  . Swelling of your face and throat  . A fast heartbeat  . A bad rash all over your body  . Dizziness and weakness    Immunizations Administered    Name Date Dose VIS Date Route   Moderna COVID-19 Vaccine 08/27/2019  9:34 AM 0.5 mL 05/31/2019 Intramuscular   Manufacturer: Moderna   Lot: [id number]   NDC: 62694-854-62

## 2019-09-08 ENCOUNTER — Ambulatory Visit (INDEPENDENT_AMBULATORY_CARE_PROVIDER_SITE_OTHER): Payer: BC Managed Care – PPO | Admitting: Psychology

## 2019-09-08 DIAGNOSIS — F4322 Adjustment disorder with anxiety: Secondary | ICD-10-CM

## 2019-09-22 ENCOUNTER — Ambulatory Visit (INDEPENDENT_AMBULATORY_CARE_PROVIDER_SITE_OTHER): Payer: BC Managed Care – PPO | Admitting: Psychology

## 2019-09-22 DIAGNOSIS — F4322 Adjustment disorder with anxiety: Secondary | ICD-10-CM

## 2019-09-27 ENCOUNTER — Ambulatory Visit: Payer: Self-pay

## 2019-10-01 ENCOUNTER — Ambulatory Visit: Payer: Self-pay

## 2019-10-06 ENCOUNTER — Ambulatory Visit (INDEPENDENT_AMBULATORY_CARE_PROVIDER_SITE_OTHER): Payer: BC Managed Care – PPO | Admitting: Psychology

## 2019-10-06 DIAGNOSIS — F4322 Adjustment disorder with anxiety: Secondary | ICD-10-CM | POA: Diagnosis not present

## 2019-10-20 ENCOUNTER — Ambulatory Visit: Payer: BC Managed Care – PPO | Admitting: Psychology

## 2019-11-10 ENCOUNTER — Ambulatory Visit (INDEPENDENT_AMBULATORY_CARE_PROVIDER_SITE_OTHER): Payer: BC Managed Care – PPO | Admitting: Psychology

## 2019-11-10 DIAGNOSIS — F4322 Adjustment disorder with anxiety: Secondary | ICD-10-CM | POA: Diagnosis not present

## 2019-11-24 ENCOUNTER — Ambulatory Visit (INDEPENDENT_AMBULATORY_CARE_PROVIDER_SITE_OTHER): Payer: BC Managed Care – PPO | Admitting: Psychology

## 2019-11-24 DIAGNOSIS — F4322 Adjustment disorder with anxiety: Secondary | ICD-10-CM

## 2019-12-08 ENCOUNTER — Ambulatory Visit: Payer: BC Managed Care – PPO | Admitting: Psychology

## 2019-12-22 ENCOUNTER — Ambulatory Visit (INDEPENDENT_AMBULATORY_CARE_PROVIDER_SITE_OTHER): Payer: BC Managed Care – PPO | Admitting: Psychology

## 2019-12-22 DIAGNOSIS — F4322 Adjustment disorder with anxiety: Secondary | ICD-10-CM | POA: Diagnosis not present

## 2020-01-03 ENCOUNTER — Other Ambulatory Visit: Payer: Self-pay | Admitting: Primary Care

## 2020-01-03 DIAGNOSIS — Z0000 Encounter for general adult medical examination without abnormal findings: Secondary | ICD-10-CM

## 2020-01-03 DIAGNOSIS — Z1159 Encounter for screening for other viral diseases: Secondary | ICD-10-CM

## 2020-01-06 ENCOUNTER — Other Ambulatory Visit (INDEPENDENT_AMBULATORY_CARE_PROVIDER_SITE_OTHER): Payer: BC Managed Care – PPO

## 2020-01-06 ENCOUNTER — Other Ambulatory Visit: Payer: Self-pay

## 2020-01-06 DIAGNOSIS — Z1159 Encounter for screening for other viral diseases: Secondary | ICD-10-CM

## 2020-01-06 DIAGNOSIS — Z0000 Encounter for general adult medical examination without abnormal findings: Secondary | ICD-10-CM

## 2020-01-09 LAB — COMPREHENSIVE METABOLIC PANEL
AG Ratio: 1.7 (calc) (ref 1.0–2.5)
ALT: 22 U/L (ref 6–29)
AST: 25 U/L (ref 10–30)
Albumin: 4.5 g/dL (ref 3.6–5.1)
Alkaline phosphatase (APISO): 47 U/L (ref 31–125)
BUN: 15 mg/dL (ref 7–25)
CO2: 22 mmol/L (ref 20–32)
Calcium: 9.5 mg/dL (ref 8.6–10.2)
Chloride: 102 mmol/L (ref 98–110)
Creat: 0.76 mg/dL (ref 0.50–1.10)
Globulin: 2.7 g/dL (calc) (ref 1.9–3.7)
Glucose, Bld: 97 mg/dL (ref 65–99)
Potassium: 3.9 mmol/L (ref 3.5–5.3)
Sodium: 138 mmol/L (ref 135–146)
Total Bilirubin: 0.4 mg/dL (ref 0.2–1.2)
Total Protein: 7.2 g/dL (ref 6.1–8.1)

## 2020-01-09 LAB — CBC
HCT: 35.3 % (ref 35.0–45.0)
Hemoglobin: 11.8 g/dL (ref 11.7–15.5)
MCH: 29.9 pg (ref 27.0–33.0)
MCHC: 33.4 g/dL (ref 32.0–36.0)
MCV: 89.6 fL (ref 80.0–100.0)
MPV: 10.3 fL (ref 7.5–12.5)
Platelets: 237 10*3/uL (ref 140–400)
RBC: 3.94 10*6/uL (ref 3.80–5.10)
RDW: 13.1 % (ref 11.0–15.0)
WBC: 5.2 10*3/uL (ref 3.8–10.8)

## 2020-01-09 LAB — LIPID PANEL
Cholesterol: 173 mg/dL (ref <200)
HDL: 71 mg/dL (ref >=50)
LDL Cholesterol (Calc): 88 mg/dL (calc)
Non-HDL Cholesterol (Calc): 102 mg/dL (calc) (ref <130)
Total CHOL/HDL Ratio: 2.4 (calc) (ref <5.0)
Triglycerides: 49 mg/dL (ref <150)

## 2020-01-09 LAB — HEPATITIS C ANTIBODY
Hepatitis C Ab: NONREACTIVE
SIGNAL TO CUT-OFF: 0 (ref <1.00)

## 2020-01-10 ENCOUNTER — Other Ambulatory Visit (HOSPITAL_COMMUNITY)
Admission: RE | Admit: 2020-01-10 | Discharge: 2020-01-10 | Disposition: A | Discharge status: Home / Self Care | Payer: BC Managed Care – PPO | Source: Ambulatory Visit | Attending: Primary Care | Admitting: Primary Care

## 2020-01-10 ENCOUNTER — Ambulatory Visit (INDEPENDENT_AMBULATORY_CARE_PROVIDER_SITE_OTHER): Payer: BC Managed Care – PPO | Admitting: Primary Care

## 2020-01-10 ENCOUNTER — Encounter: Payer: Self-pay | Admitting: Primary Care

## 2020-01-10 ENCOUNTER — Other Ambulatory Visit: Payer: Self-pay

## 2020-01-10 VITALS — BP 120/72 | HR 89 | Temp 95.8°F | Ht 64.5 in | Wt 132.0 lb

## 2020-01-10 DIAGNOSIS — Z124 Encounter for screening for malignant neoplasm of cervix: Secondary | ICD-10-CM

## 2020-01-10 DIAGNOSIS — Z0000 Encounter for general adult medical examination without abnormal findings: Secondary | ICD-10-CM

## 2020-01-10 DIAGNOSIS — Z30011 Encounter for initial prescription of contraceptive pills: Secondary | ICD-10-CM | POA: Diagnosis not present

## 2020-01-10 DIAGNOSIS — Z309 Encounter for contraceptive management, unspecified: Secondary | ICD-10-CM | POA: Insufficient documentation

## 2020-01-10 LAB — POCT URINE PREGNANCY: Preg Test, Ur: NEGATIVE

## 2020-01-10 MED ORDER — NORETHINDRONE ACET-ETHINYL EST 1-20 MG-MCG PO TABS: 1.0000 | ORAL_TABLET | Freq: Every day | ORAL | 3 refills | Status: DC

## 2020-01-10 NOTE — Patient Instructions (Signed)
Take your first birth control pill on the Sunday after your period starts. For example, if you start your period on Monday, then take the birth control pill that following Sunday. If you start your period on Sunday, then take your first pill that same day.  You must take your pill at the same time each day.  If you miss a pill then take the pill as soon as you remember, and also take your pill at the regularly scheduled time, even if this means taking two pills in one day. If you miss more than two pills consecutively, then please call me.  You need 2 weeks of back up protection for pregnancy prevention.   Continue to work on a healthy diet. Ensure you are consuming 64 ounces of water daily.  Continue exercising. You should be getting 150 minutes of moderate intensity exercise weekly.  It was a pleasure to see you today!   Preventive Care 53-82 Years Old, Female Preventive care refers to visits with your health care provider and lifestyle choices that can promote health and wellness. This includes:  A yearly physical exam. This may also be called an annual well check.  Regular dental visits and eye exams.  Immunizations.  Screening for certain conditions.  Healthy lifestyle choices, such as eating a healthy diet, getting regular exercise, not using drugs or products that contain nicotine and tobacco, and limiting alcohol use. What can I expect for my preventive care visit? Physical exam Your health care provider will check your:  Height and weight. This may be used to calculate body mass index (BMI), which tells if you are at a healthy weight.  Heart rate and blood pressure.  Skin for abnormal spots. Counseling Your health care provider may ask you questions about your:  Alcohol, tobacco, and drug use.  Emotional well-being.  Home and relationship well-being.  Sexual activity.  Eating habits.  Work and work Statistician.  Method of birth control.  Menstrual  cycle.  Pregnancy history. What immunizations do I need?  Influenza (flu) vaccine  This is recommended every year. Tetanus, diphtheria, and pertussis (Tdap) vaccine  You may need a Td booster every 10 years. Varicella (chickenpox) vaccine  You may need this if you have not been vaccinated. Human papillomavirus (HPV) vaccine  If recommended by your health care provider, you may need three doses over 6 months. Measles, mumps, and rubella (MMR) vaccine  You may need at least one dose of MMR. You may also need a second dose. Meningococcal conjugate (MenACWY) vaccine  One dose is recommended if you are age 62-21 years and a first-year college student living in a residence hall, or if you have one of several medical conditions. You may also need additional booster doses. Pneumococcal conjugate (PCV13) vaccine  You may need this if you have certain conditions and were not previously vaccinated. Pneumococcal polysaccharide (PPSV23) vaccine  You may need one or two doses if you smoke cigarettes or if you have certain conditions. Hepatitis A vaccine  You may need this if you have certain conditions or if you travel or work in places where you may be exposed to hepatitis A. Hepatitis B vaccine  You may need this if you have certain conditions or if you travel or work in places where you may be exposed to hepatitis B. Haemophilus influenzae type b (Hib) vaccine  You may need this if you have certain conditions. You may receive vaccines as individual doses or as more than one vaccine together  in one shot (combination vaccines). Talk with your health care provider about the risks and benefits of combination vaccines. What tests do I need?  Blood tests  Lipid and cholesterol levels. These may be checked every 5 years starting at age 56.  Hepatitis C test.  Hepatitis B test. Screening  Diabetes screening. This is done by checking your blood sugar (glucose) after you have not eaten  for a while (fasting).  Sexually transmitted disease (STD) testing.  BRCA-related cancer screening. This may be done if you have a family history of breast, ovarian, tubal, or peritoneal cancers.  Pelvic exam and Pap test. This may be done every 3 years starting at age 78. Starting at age 46, this may be done every 5 years if you have a Pap test in combination with an HPV test. Talk with your health care provider about your test results, treatment options, and if necessary, the need for more tests. Follow these instructions at home: Eating and drinking   Eat a diet that includes fresh fruits and vegetables, whole grains, lean protein, and low-fat dairy.  Take vitamin and mineral supplements as recommended by your health care provider.  Do not drink alcohol if: ? Your health care provider tells you not to drink. ? You are pregnant, may be pregnant, or are planning to become pregnant.  If you drink alcohol: ? Limit how much you have to 0-1 drink a day. ? Be aware of how much alcohol is in your drink. In the [place] one drink equals one 12 oz bottle of beer (355 mL), one 5 oz glass of wine (148 mL), or one 1 oz glass of hard liquor (44 mL). Lifestyle  Take daily care of your teeth and gums.  Stay active. Exercise for at least 30 minutes on 5 or more days each week.  Do not use any products that contain nicotine or tobacco, such as cigarettes, e-cigarettes, and chewing tobacco. If you need help quitting, ask your health care provider.  If you are sexually active, practice safe sex. Use a condom or other form of birth control (contraception) in order to prevent pregnancy and STIs (sexually transmitted infections). If you plan to become pregnant, see your health care provider for a preconception visit. What's next?  Visit your health care provider once a year for a well check visit.  Ask your health care provider how often you should have your eyes and teeth checked.  Stay up to date  on all vaccines. This information is not intended to replace advice given to you by your health care provider. Make sure you discuss any questions you have with your health care provider. Document Revised: 02/25/2018 Document Reviewed: 02/25/2018 Elsevier Patient Education  2020 [employer].

## 2020-01-10 NOTE — Progress Notes (Addendum)
Subjective:    Patient ID: Terry [name], female    DOB: March 22, 1991, 29 y.o.   MRN: [id number]  HPI  This visit occurred during the SARS-CoV-2 public health emergency.  Safety protocols were in place, including screening questions prior to the visit, additional usage of staff PPE, and extensive cleaning of exam room while observing appropriate contact time as indicated for disinfecting solutions.   Terry [name] is a 29 year old female who presents today for complete physical.  She would like to start OCP's for pregnancy prevention. She has never been on birth control. Menses is monthly lasting 5-6 days. She denies menorrhagia and dysmenorrhea. LMP 01/02/20.  Immunizations: -Tetanus: Completed in 2010, due today. -Influenza: Completed last season  -Covid-19: Completed series  Diet: She endorses a fair diet.  Exercise: She is active at camp, runs 25 miles weekly  Eye exam: Completed in 2019 Dental exam: Completes semi-annually   Pap Smear: No recent on file. Due today.  BP Readings from Last 3 Encounters:  01/10/20 120/72  12/14/18 106/68  11/02/18 112/74     Review of Systems  Constitutional: Negative for unexpected weight change.  HENT: Negative for rhinorrhea.   Eyes: Negative for visual disturbance.  Respiratory: Negative for cough and shortness of breath.   Cardiovascular: Negative for chest pain.  Gastrointestinal: Negative for constipation and diarrhea.  Genitourinary: Negative for difficulty urinating and menstrual problem.  Musculoskeletal: Negative for arthralgias and myalgias.  Skin: Negative for rash.  Allergic/Immunologic: Negative for environmental allergies.  Neurological: Negative for dizziness, numbness and headaches.  Hematological: Negative for adenopathy.  Psychiatric/Behavioral: The patient is not nervous/anxious.        Past Medical History:  Diagnosis Date  . Chicken pox      Social History   Socioeconomic History  . Marital status:  Single    Spouse name: Not on file  . Number of children: Not on file  . Years of education: Not on file  . Highest education level: Not on file  Occupational History  . Not on file  Tobacco Use  . Smoking status: Never Smoker  . Smokeless tobacco: Never Used  Substance and Sexual Activity  . Alcohol use: Yes    Alcohol/week: 0.0 standard drinks    Comment: social  . Drug use: Not on file  . Sexual activity: Not on file  Other Topics Concern  . Not on file  Social History Narrative   Single.   Highest level of education completed Bachelors Degree.   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   Enjoys reading, running, hiking, camping.   Social Determinants of Health   Financial Resource Strain:   . Difficulty of Paying Living Expenses:   Food Insecurity:   . Worried About Programme researcher, broadcasting/film/video in the Last Year:   . Barista in the Last Year:   Transportation Needs:   . Freight forwarder (Medical):   Marland Kitchen Lack of Transportation (Non-Medical):   Physical Activity:   . Days of Exercise per Week:   . Minutes of Exercise per Session:   Stress:   . Feeling of Stress :   Social Connections:   . Frequency of Communication with Friends and Family:   . Frequency of Social Gatherings with Friends and Family:   . Attends Religious Services:   . Active Member of Clubs or Organizations:   . Attends Banker Meetings:   Marland Kitchen Marital Status:   Intimate Partner Violence:   .  Fear of Current or Ex-Partner:   . Emotionally Abused:   Marland Kitchen Physically Abused:   . Sexually Abused:     No past surgical history on file.  Family History  Problem Relation Age of Onset  . Hypertension Mother   . Mental retardation Mother   . Cancer Maternal Grandmother        breast  . Hypertension Maternal Grandmother     No Known Allergies  Current Outpatient Medications on File Prior to Visit  Medication Sig Dispense Refill  . Diclofenac Sodium 2 % SOLN Place 2 g onto the  skin 2 (two) times daily. 112 g 3   No current facility-administered medications on file prior to visit.    BP 120/72   Pulse 89   Temp (!) 95.8 F (35.4 C) (Temporal)   Ht 5' 4.5" (1.638 m)   Wt 132 lb (59.9 kg)   LMP 01/02/2020   SpO2 99%   BMI 22.31 kg/m    Objective:   Physical Exam Exam conducted with a chaperone present.  HENT:     Right Ear: Tympanic membrane and ear canal normal.     Left Ear: Tympanic membrane and ear canal normal.  Eyes:     Pupils: Pupils are equal, round, and reactive to light.  Cardiovascular:     Rate and Rhythm: Normal rate and regular rhythm.  Pulmonary:     Effort: Pulmonary effort is normal.     Breath sounds: Normal breath sounds.  Abdominal:     General: Bowel sounds are normal.     Palpations: Abdomen is soft.     Tenderness: There is no abdominal tenderness.  Genitourinary:    Labia:        Right: No tenderness or lesion.        Left: No tenderness or lesion.      Cervix: Discharge present. No cervical motion tenderness, erythema or cervical bleeding.     Uterus: Normal.      Adnexa: Right adnexa normal and left adnexa normal.     Comments: Scant amount of whitish discharge, no foul smell. Musculoskeletal:        General: Normal range of motion.     Cervical back: Neck supple.  Skin:    General: Skin is warm and dry.  Neurological:     Mental Status: She is alert and oriented to person, place, and time.     Cranial Nerves: No cranial nerve deficit.     Deep Tendon Reflexes:     Reflex Scores:      Patellar reflexes are 2+ on the right side and 2+ on the left side. Psychiatric:        Mood and Affect: Mood normal.            Assessment & Plan:

## 2020-01-10 NOTE — Assessment & Plan Note (Signed)
Requesting OCP's for pregnancy prevention. Normal menstrual cycles, no complications. Pap smear due and completed today.  Discussed instructions for OCP's including start date, missed pills, and initial back up protection against pregnancy.  Urine pregnancy negative.  Rx for Junel 1/20 sent to pharmacy.

## 2020-01-10 NOTE — Assessment & Plan Note (Signed)
Tetanus due, provided today. Pap smear due, completed today. Commended her on regular exercise, healthy diet.  Exam today unremarkable. Labs reviewed.

## 2020-01-13 LAB — CYTOLOGY - PAP
Comment: NEGATIVE
Diagnosis: NEGATIVE
High risk HPV: NEGATIVE

## 2020-02-08 IMAGING — DX DG FOOT COMPLETE 3+V*R*
3 series · 3 of 3 positions shown · non-contrast
Comparison: None.

CLINICAL DATA: Pain at the first MTP joint.  Distance runner.

EXAM:
RIGHT FOOT COMPLETE - 3+ VIEW

[foot ap]
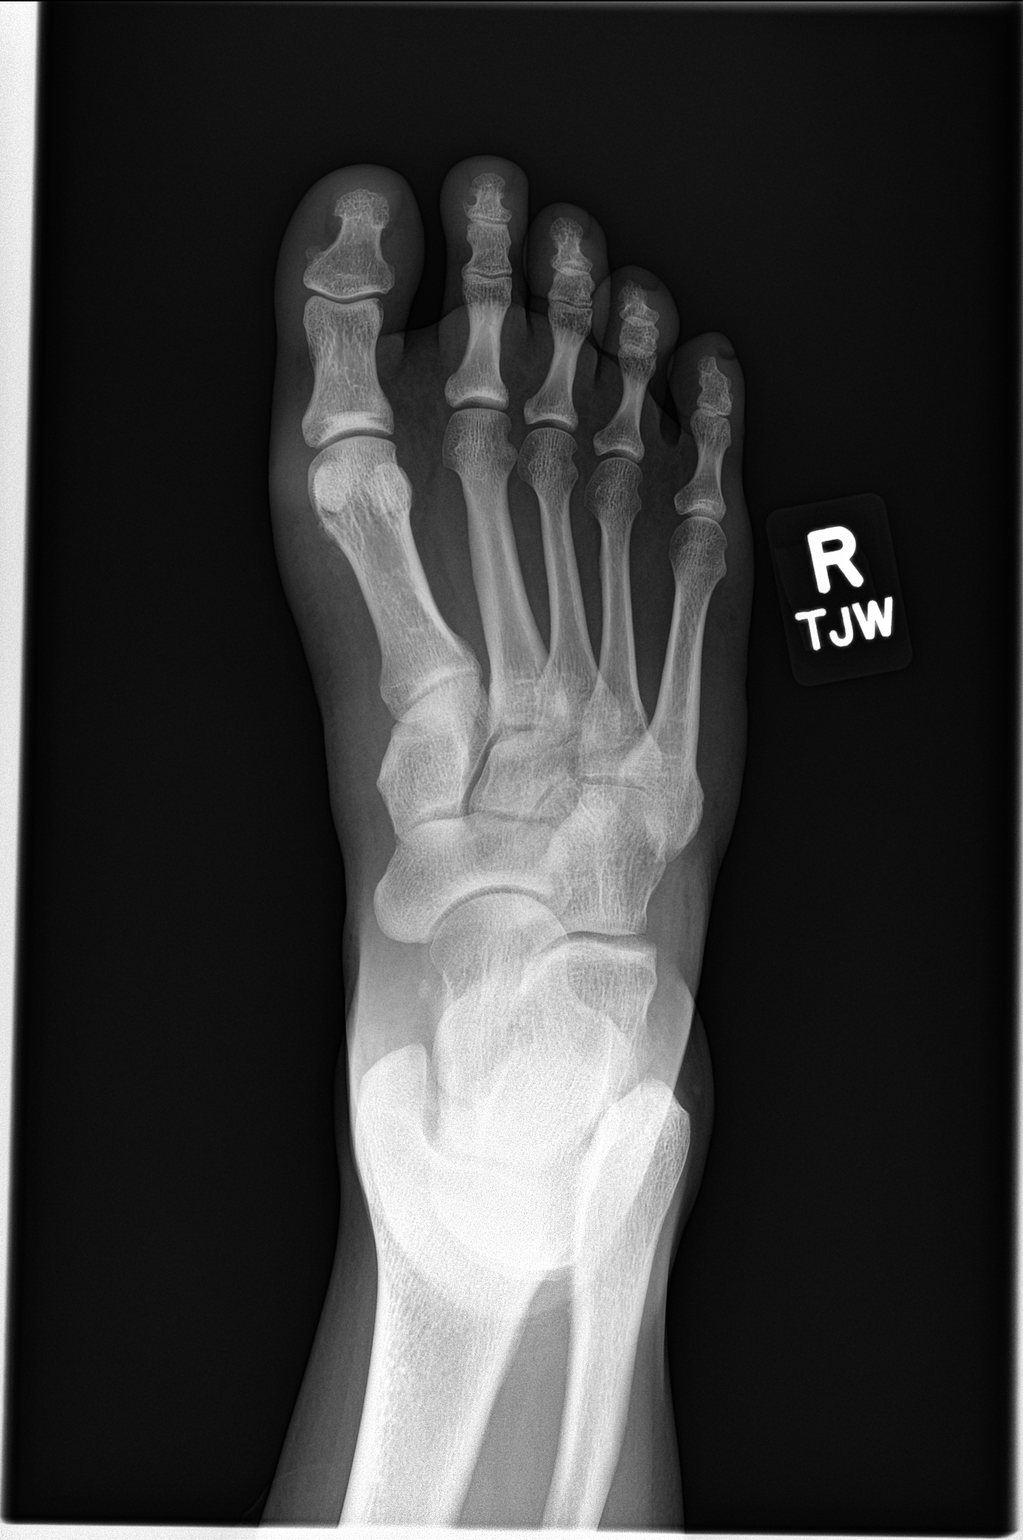

[foot obl]
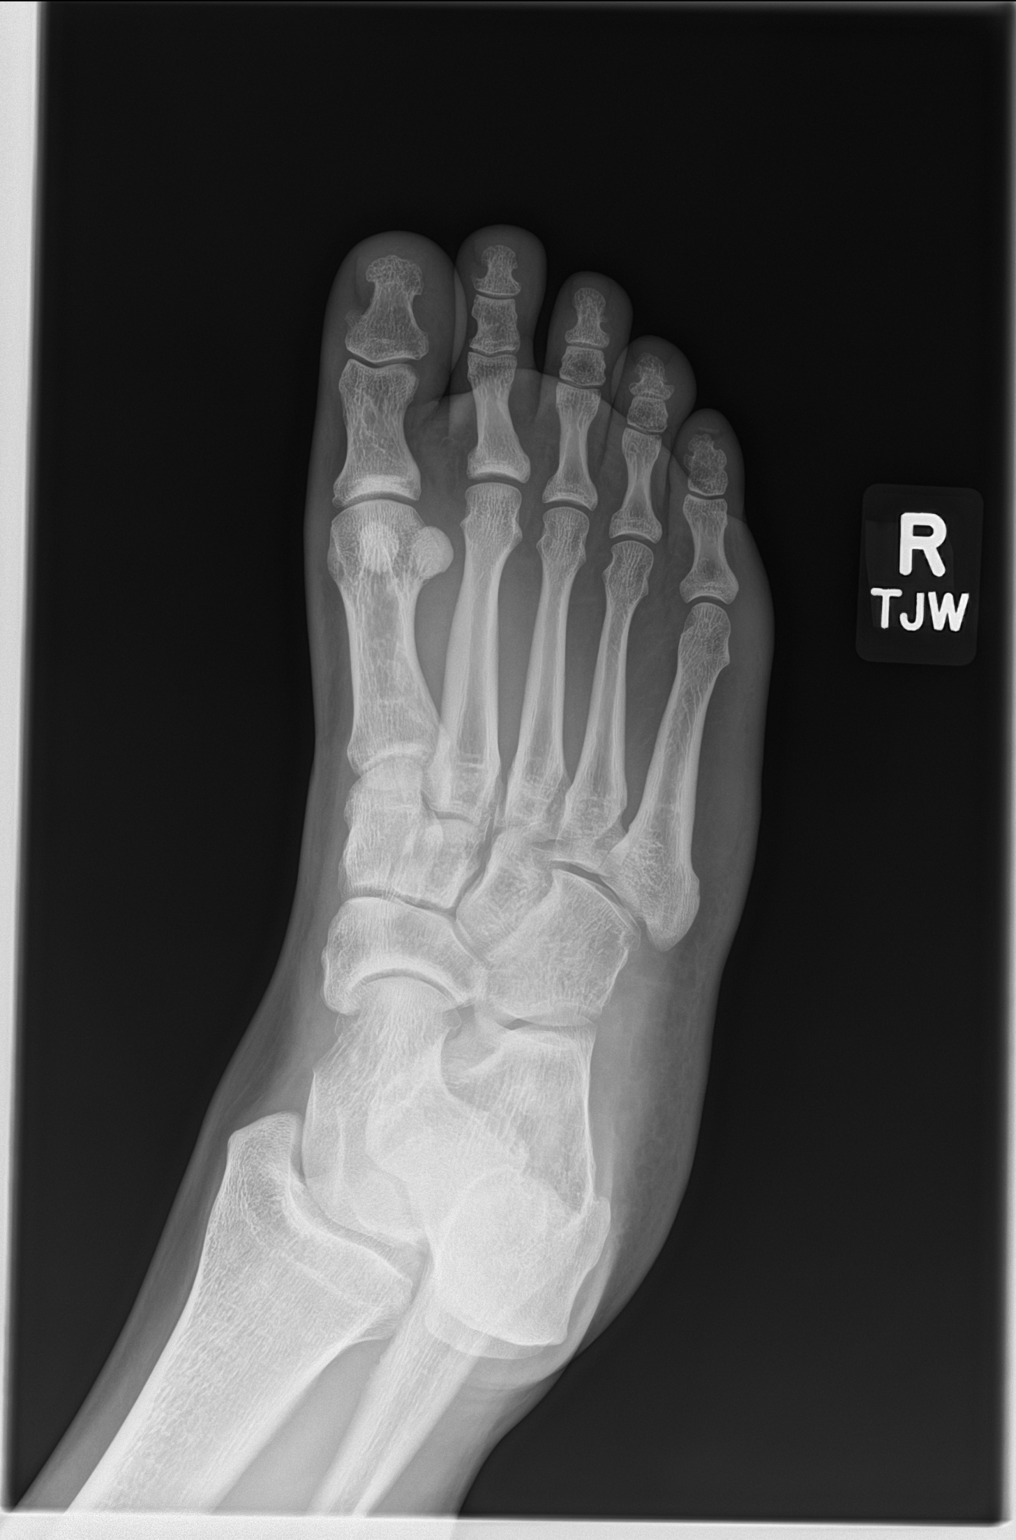

[foot lat]
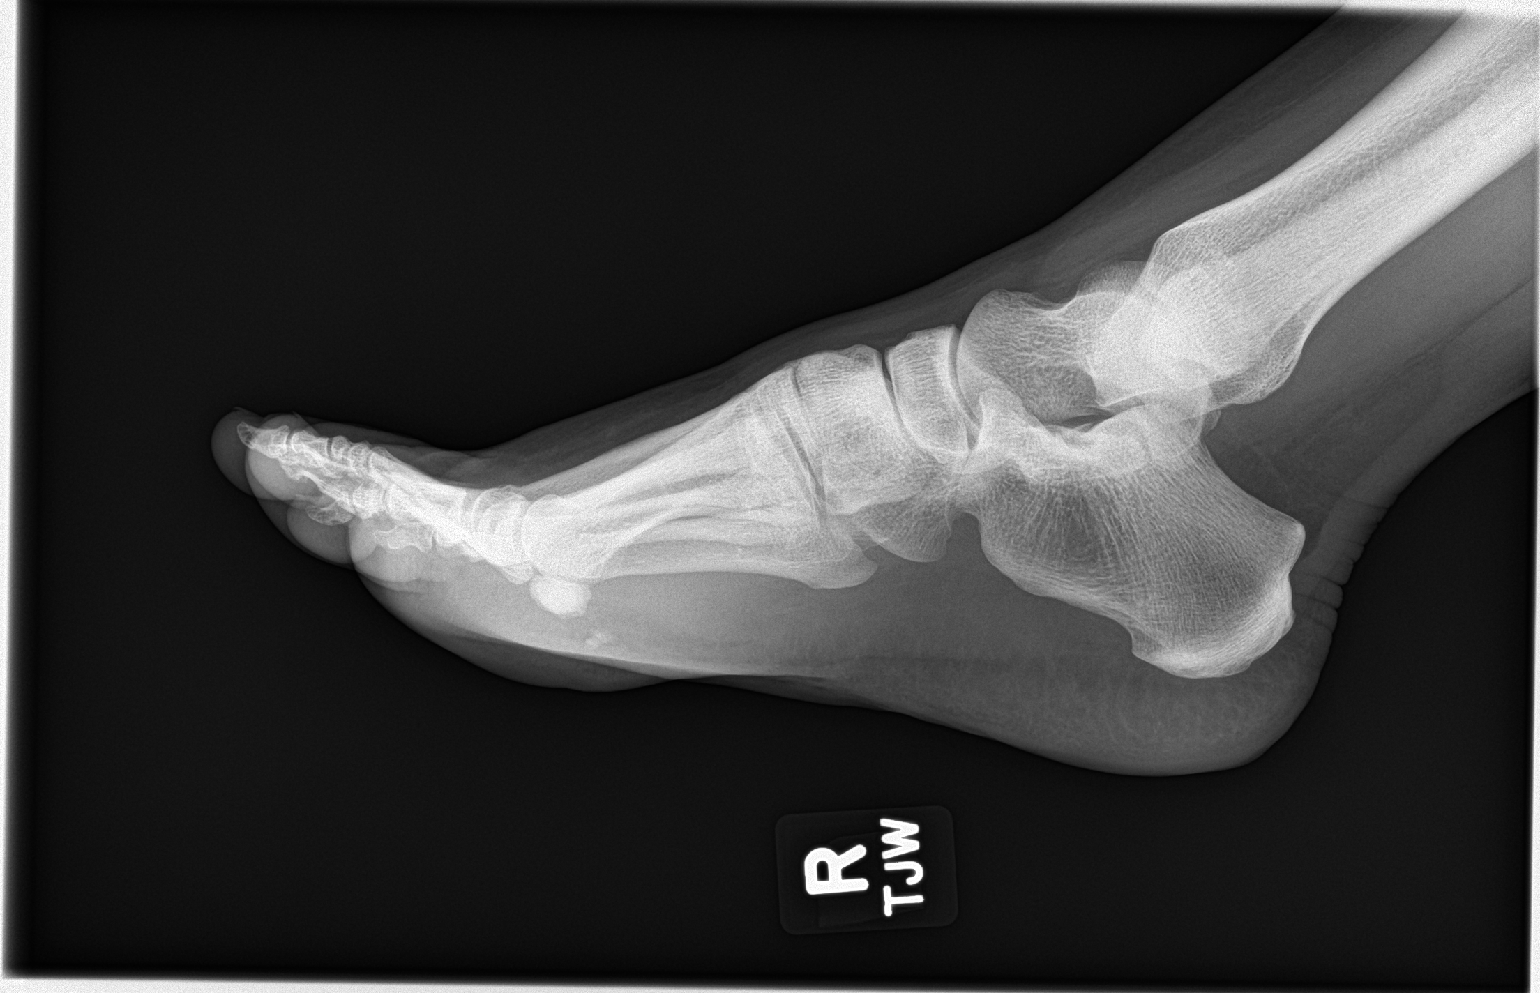

[3 of 3 positions shown; findings below may reference images not displayed]

FINDINGS: The joint spaces are maintained. No acute bony findings or erosive
changes. No obvious stress fracture.

On the lateral film there appear to be subtle soft tissue
calcifications in the plantar aspect of the foot just superficial to
the sesamoid bones of the great toe. This is a nonspecific finding
and could be due to dystrophic calcifications related to prior
trauma or possible foreign body CT may be helpful for further
evaluation if clinically necessary.
IMPRESSION: No acute bony findings or significant degenerative changes.

Suspect small subcutaneous soft tissue calcifications along the
plantar aspect of the foot at the level of the first metatarsal
head.

## 2020-02-14 ENCOUNTER — Ambulatory Visit (INDEPENDENT_AMBULATORY_CARE_PROVIDER_SITE_OTHER): Payer: BC Managed Care – PPO | Admitting: Psychology

## 2020-02-14 DIAGNOSIS — F4322 Adjustment disorder with anxiety: Secondary | ICD-10-CM | POA: Diagnosis not present

## 2020-02-23 IMAGING — DX DG FOOT COMPLETE 3+V*R*
3 series · 3 of 3 positions shown · non-contrast
Comparison: None.

CLINICAL DATA: Pain at the first metatarsal.

EXAM:
RIGHT FOOT COMPLETE - 3+ VIEW

[foot ap]
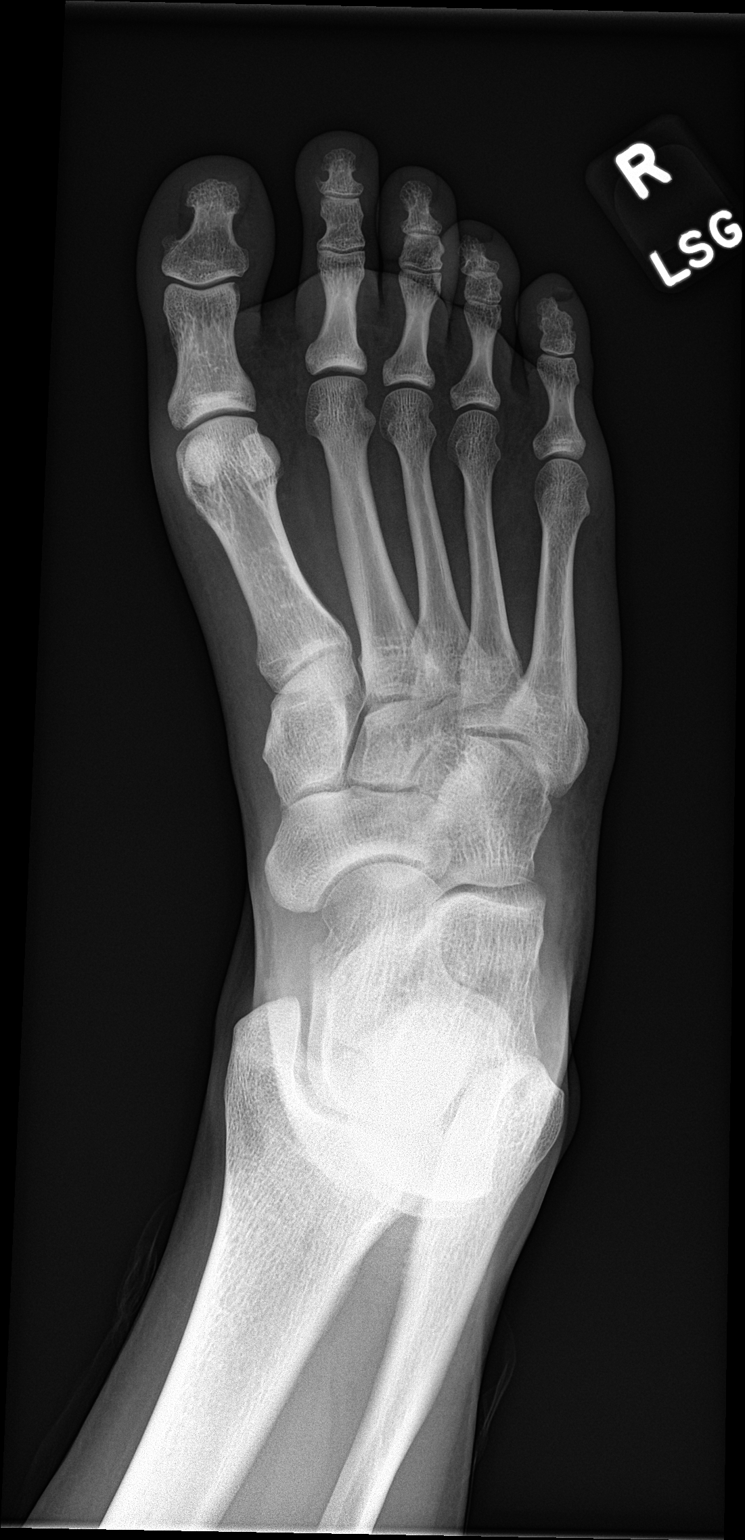

[foot obl]
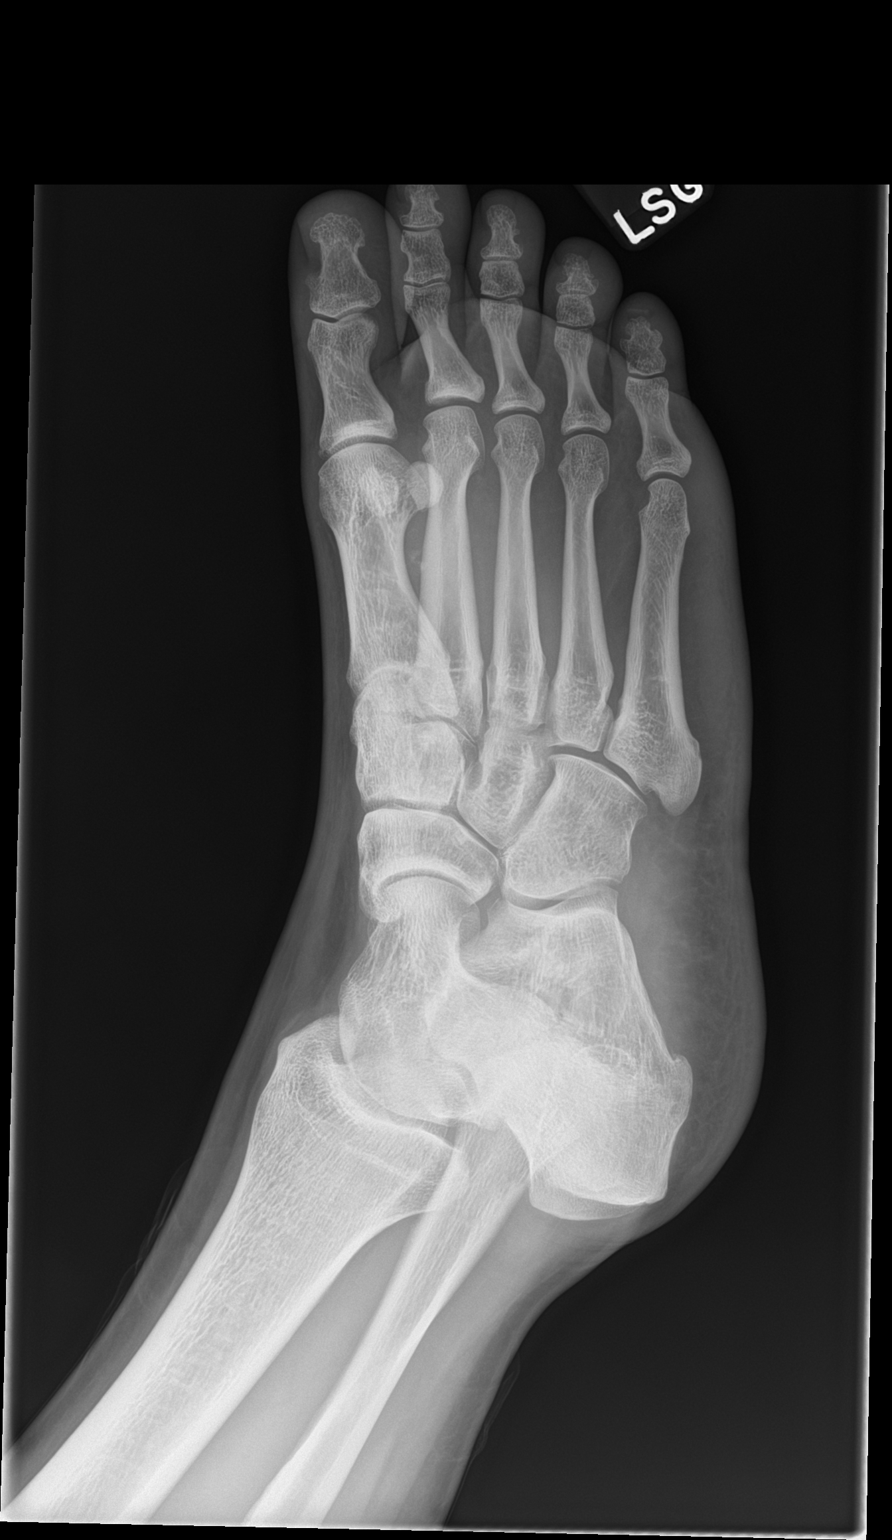

[foot lat]
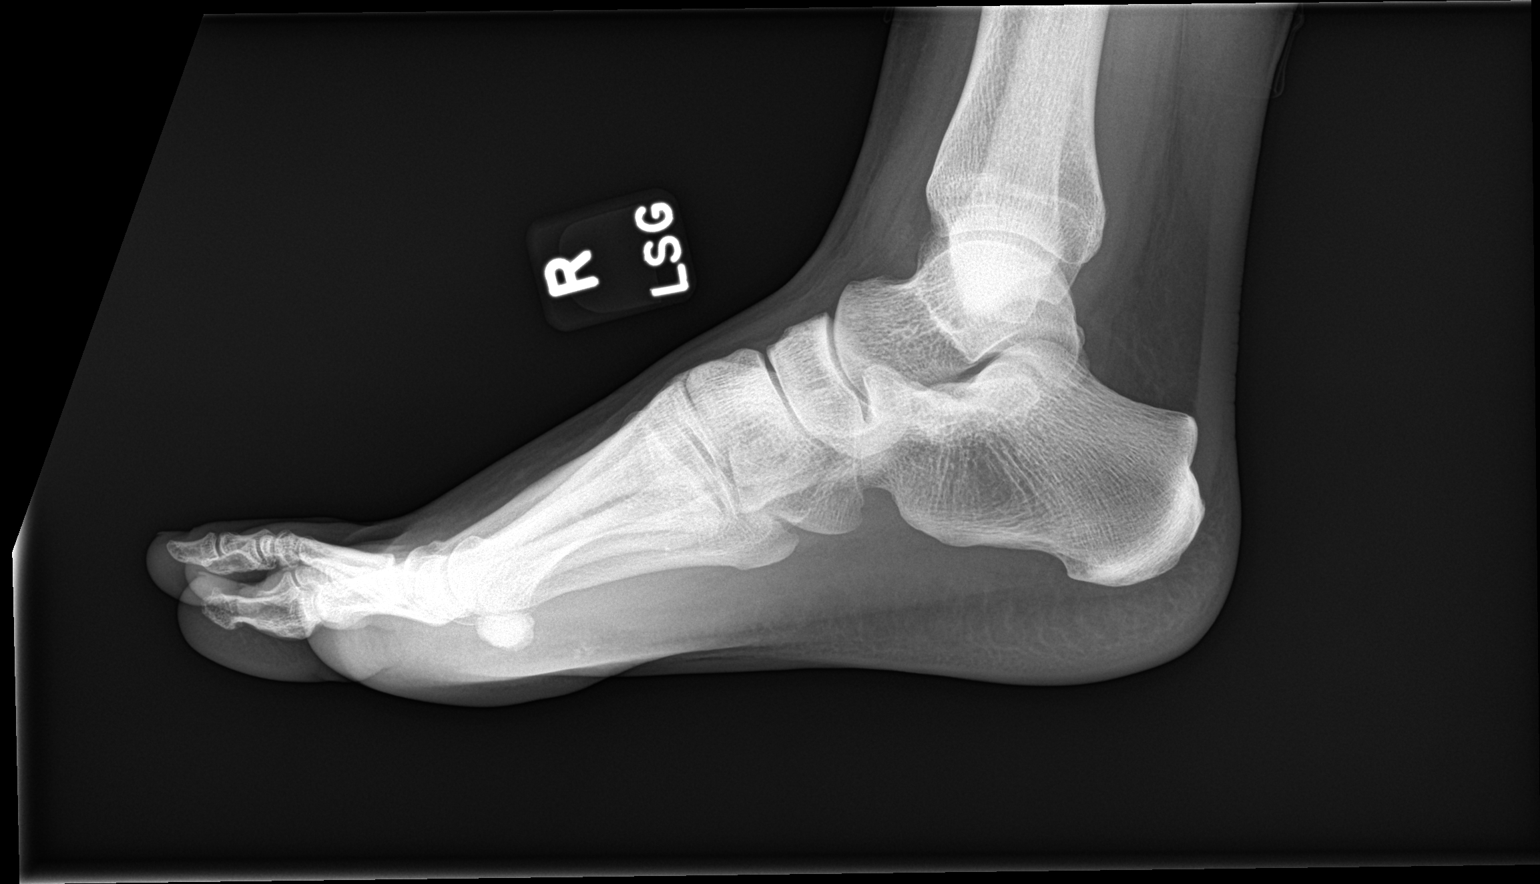

[3 of 3 positions shown; findings below may reference images not displayed]

FINDINGS: There is no evidence of fracture or dislocation. No periosteal new
bone formation. There is no evidence of arthropathy or other focal
bone abnormality. Unchanged plantar soft tissue calcification.
IMPRESSION: No fracture of the right foot. No periosteal new bone formation that
would indicate a radio-occult fracture.

## 2020-03-28 ENCOUNTER — Ambulatory Visit (INDEPENDENT_AMBULATORY_CARE_PROVIDER_SITE_OTHER): Payer: BC Managed Care – PPO | Admitting: Psychology

## 2020-03-28 DIAGNOSIS — F4322 Adjustment disorder with anxiety: Secondary | ICD-10-CM | POA: Diagnosis not present

## 2020-05-28 ENCOUNTER — Ambulatory Visit (INDEPENDENT_AMBULATORY_CARE_PROVIDER_SITE_OTHER): Payer: BC Managed Care – PPO | Admitting: Psychology

## 2020-05-28 DIAGNOSIS — F4322 Adjustment disorder with anxiety: Secondary | ICD-10-CM

## 2020-07-30 ENCOUNTER — Ambulatory Visit: Payer: BC Managed Care – PPO | Admitting: Psychology

## 2020-09-25 ENCOUNTER — Ambulatory Visit (INDEPENDENT_AMBULATORY_CARE_PROVIDER_SITE_OTHER): Payer: BC Managed Care – PPO | Admitting: Psychology

## 2020-09-25 DIAGNOSIS — F4322 Adjustment disorder with anxiety: Secondary | ICD-10-CM | POA: Diagnosis not present

## 2020-11-28 ENCOUNTER — Other Ambulatory Visit: Payer: Self-pay

## 2020-11-28 DIAGNOSIS — Z0283 Encounter for blood-alcohol and blood-drug test: Secondary | ICD-10-CM

## 2020-11-28 NOTE — Progress Notes (Signed)
Presents to [facility] for on-site pre-employment Drug Screen.  [facility] Acct #:  [id number] [facility] Specimen #:  [id number]  Rapid Drug Screen Results = Negative  AMD

## 2020-12-21 ENCOUNTER — Other Ambulatory Visit: Payer: Self-pay | Admitting: Primary Care

## 2020-12-21 DIAGNOSIS — Z30011 Encounter for initial prescription of contraceptive pills: Secondary | ICD-10-CM

## 2021-01-21 ENCOUNTER — Other Ambulatory Visit: Payer: Self-pay | Admitting: Primary Care

## 2021-01-21 DIAGNOSIS — Z3041 Encounter for surveillance of contraceptive pills: Secondary | ICD-10-CM

## 2021-01-22 NOTE — Telephone Encounter (Signed)
Office visit required for further refills.   Also, it looks like the requested Rx is for the added iron version as compared to the regular version on her medication list. Can you find out what's going on? Last refill, patient will need to be seen.

## 2021-01-28 NOTE — Telephone Encounter (Signed)
Pt called back appointment made for 02/05/2021 for Cpe. She states that she has not had any change in medications and not sure why a different one was requested.

## 2021-01-28 NOTE — Telephone Encounter (Signed)
Left message to return call to our office.  

## 2021-01-29 NOTE — Telephone Encounter (Signed)
Please call pharmacy to find out.

## 2021-02-04 NOTE — Telephone Encounter (Signed)
Called pharmacy states that the only one that has iron is the placebo pill has extra iron. The change was requested do to other being on back order often.

## 2021-02-05 ENCOUNTER — Other Ambulatory Visit: Payer: Self-pay

## 2021-02-05 ENCOUNTER — Ambulatory Visit: Payer: BC Managed Care – PPO | Admitting: Primary Care

## 2021-02-05 ENCOUNTER — Encounter: Payer: Self-pay | Admitting: Primary Care

## 2021-02-05 VITALS — BP 104/60 | HR 61 | Temp 98.6°F | Ht 64.5 in | Wt 138.0 lb

## 2021-02-05 DIAGNOSIS — Z0000 Encounter for general adult medical examination without abnormal findings: Secondary | ICD-10-CM | POA: Diagnosis not present

## 2021-02-05 DIAGNOSIS — Z23 Encounter for immunization: Secondary | ICD-10-CM

## 2021-02-05 DIAGNOSIS — Z3041 Encounter for surveillance of contraceptive pills: Secondary | ICD-10-CM | POA: Diagnosis not present

## 2021-02-05 MED ORDER — BLISOVI FE 1/20 1-20 MG-MCG PO TABS: 1.0000 | ORAL_TABLET | Freq: Every day | ORAL | 3 refills | Status: DC

## 2021-02-05 NOTE — Addendum Note (Signed)
Addended by: Donnamarie Poag on: 02/05/2021 03:45 PM   Modules accepted: Orders

## 2021-02-05 NOTE — Assessment & Plan Note (Addendum)
Doing well on OCP's, refills provided. Pap smear UTD.

## 2021-02-05 NOTE — Assessment & Plan Note (Signed)
Tetanus due, provided today.  Pap smear UTD.  Commended her on regular exercise and healthy diet.   Exam today stable. Labs from last year reviewed and are unremarkable. Will complete labs next year. She agrees.

## 2021-02-05 NOTE — Progress Notes (Signed)
Subjective:    Patient ID: Terry [name], female    DOB: [birth year], 30 y.o.   MRN: [id number]  HPI  Terry [name] is a very pleasant 30 y.o. female who presents today for complete physical and follow up of chronic conditions.  Immunizations: -Tetanus: 2010, due today -Influenza: Due this season  -Covid-19: 3 vaccines   Diet: Fair diet.  Exercise: Regular exercise.  Eye exam: Completes annually  Dental exam: Completes semi-annually   Pap Smear: Completed in July 2021  BP Readings from Last 3 Encounters:  02/05/21 104/60  01/10/20 120/72  12/14/18 106/68       Review of Systems  Constitutional:  Negative for unexpected weight change.  HENT:  Negative for rhinorrhea.   Respiratory:  Negative for shortness of breath.   Cardiovascular:  Negative for chest pain.  Gastrointestinal:  Negative for constipation and diarrhea.  Genitourinary:  Negative for difficulty urinating and menstrual problem.  Musculoskeletal:  Negative for arthralgias and myalgias.  Skin:  Negative for rash.  Allergic/Immunologic: Negative for environmental allergies.  Neurological:  Negative for dizziness and headaches.  Psychiatric/Behavioral:  The patient is not nervous/anxious.         Past Medical History:  Diagnosis Date   Chicken pox     Social History   Socioeconomic History   Marital status: Single    Spouse name: Not on file   Number of children: Not on file   Years of education: Not on file   Highest education level: Not on file  Occupational History   Not on file  Tobacco Use   Smoking status: Never   Smokeless tobacco: Never  Substance and Sexual Activity   Alcohol use: Yes    Alcohol/week: 0.0 standard drinks    Comment: social   Drug use: Not on file   Sexual activity: Not on file  Other Topics Concern   Not on file  Social History Narrative   Single.   Highest level of education completed Bachelors Degree.   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   Enjoys reading, running, hiking, camping.   Social Determinants of Health   Financial Resource Strain: Not on file  Food Insecurity: Not on file  Transportation Needs: Not on file  Physical Activity: Not on file  Stress: Not on file  Social Connections: Not on file  Intimate Partner Violence: Not on file    History reviewed. No pertinent surgical history.  Family History  Problem Relation Age of Onset   Hypertension Mother    Mental retardation Mother    Cancer Maternal Grandmother        breast   Hypertension Maternal Grandmother     No Known Allergies  Current Outpatient Medications on File Prior to Visit  Medication Sig Dispense Refill   Diclofenac Sodium 2 % SOLN Place 2 g onto the skin 2 (two) times daily. 112 g 3   No current facility-administered medications on file prior to visit.    BP 104/60   Pulse 61   Temp 98.6 F (37 C) (Temporal)   Ht 5' 4.5" (1.638 m)   Wt 138 lb (62.6 kg)   SpO2 97%   BMI 23.32 kg/m  Objective:   Physical Exam HENT:     Right Ear: Tympanic membrane and ear canal normal.     Left Ear: Tympanic membrane and ear canal normal.     Nose: Nose normal.  Eyes:     Conjunctiva/sclera: Conjunctivae normal.  Pupils: Pupils are equal, round, and reactive to light.  Neck:     Thyroid: No thyromegaly.  Cardiovascular:     Rate and Rhythm: Normal rate and regular rhythm.     Heart sounds: No murmur heard. Pulmonary:     Effort: Pulmonary effort is normal.     Breath sounds: Normal breath sounds. No rales.  Abdominal:     General: Bowel sounds are normal.     Palpations: Abdomen is soft.     Tenderness: There is no abdominal tenderness.  Musculoskeletal:        General: Normal range of motion.     Cervical back: Neck supple.  Lymphadenopathy:     Cervical: No cervical adenopathy.  Skin:    General: Skin is warm and dry.     Findings: No rash.  Neurological:     Mental Status: She is alert and oriented to person,  place, and time.     Cranial Nerves: No cranial nerve deficit.     Deep Tendon Reflexes: Reflexes are normal and symmetric.  Psychiatric:        Mood and Affect: Mood normal.          Assessment & Plan:      This visit occurred during the SARS-CoV-2 public health emergency.  Safety protocols were in place, including screening questions prior to the visit, additional usage of staff PPE, and extensive cleaning of exam room while observing appropriate contact time as indicated for disinfecting solutions.

## 2021-02-05 NOTE — Patient Instructions (Signed)
It was a pleasure to see you today!  Preventive Care 61-30 Years Old, Female Preventive care refers to lifestyle choices and visits with your health care provider that can promote health and wellness. This includes: A yearly physical exam. This is also called an annual wellness visit. Regular dental and eye exams. Immunizations. Screening for certain conditions. Healthy lifestyle choices, such as: Eating a healthy diet. Getting regular exercise. Not using drugs or products that contain nicotine and tobacco. Limiting alcohol use. What can I expect for my preventive care visit? Physical exam Your health care provider may check your: Height and weight. These may be used to calculate your BMI (body mass index). BMI is a measurement that tells if you are at a healthy weight. Heart rate and blood pressure. Body temperature. Skin for abnormal spots. Counseling Your health care provider may ask you questions about your: Past medical problems. Family's medical history. Alcohol, tobacco, and drug use. Emotional well-being. Home life and relationship well-being. Sexual activity. Diet, exercise, and sleep habits. Work and work Statistician. Access to firearms. Method of birth control. Menstrual cycle. Pregnancy history. What immunizations do I need?  Vaccines are usually given at various ages, according to a schedule. Your health care provider will recommend vaccines for you based on your age, medicalhistory, and lifestyle or other factors, such as travel or where you work. What tests do I need?  Blood tests Lipid and cholesterol levels. These may be checked every 5 years starting at age 30. Hepatitis C test. Hepatitis B test. Screening Diabetes screening. This is done by checking your blood sugar (glucose) after you have not eaten for a while (fasting). STD (sexually transmitted disease) testing, if you are at risk. BRCA-related cancer screening. This may be done if you have a  family history of breast, ovarian, tubal, or peritoneal cancers. Pelvic exam and Pap test. This may be done every 3 years starting at age 30. Starting at age 30, this may be done every 5 years if you have a Pap test in combination with an HPV test. Talk with your health care provider about your test results, treatment options,and if necessary, the need for more tests. Follow these instructions at home: Eating and drinking  Eat a healthy diet that includes fresh fruits and vegetables, whole grains, lean protein, and low-fat dairy products. Take vitamin and mineral supplements as recommended by your health care provider. Do not drink alcohol if: Your health care provider tells you not to drink. You are pregnant, may be pregnant, or are planning to become pregnant. If you drink alcohol: Limit how much you have to 0-1 drink a day. Be aware of how much alcohol is in your drink. In the [place] one drink equals one 12 oz bottle of beer (355 mL), one 5 oz glass of wine (148 mL), or one 1 oz glass of hard liquor (44 mL).  Lifestyle Take daily care of your teeth and gums. Brush your teeth every morning and night with fluoride toothpaste. Floss one time each day. Stay active. Exercise for at least 30 minutes 5 or more days each week. Do not use any products that contain nicotine or tobacco, such as cigarettes, e-cigarettes, and chewing tobacco. If you need help quitting, ask your health care provider. Do not use drugs. If you are sexually active, practice safe sex. Use a condom or other form of protection to prevent STIs (sexually transmitted infections). If you do not wish to become pregnant, use a form of birth control. If  you plan to become pregnant, see your health care provider for a prepregnancy visit. Find healthy ways to cope with stress, such as: Meditation, yoga, or listening to music. Journaling. Talking to a trusted person. Spending time with friends and family. Safety Always wear your  seat belt while driving or riding in a vehicle. Do not drive: If you have been drinking alcohol. Do not ride with someone who has been drinking. When you are tired or distracted. While texting. Wear a helmet and other protective equipment during sports activities. If you have firearms in your house, make sure you follow all gun safety procedures. Seek help if you have been physically or sexually abused. What's next? Go to your health care provider once a year for an annual wellness visit. Ask your health care provider how often you should have your eyes and teeth checked. Stay up to date on all vaccines. This information is not intended to replace advice given to you by your health care provider. Make sure you discuss any questions you have with your healthcare provider. Document Revised: 02/12/2020 Document Reviewed: 02/25/2018 Elsevier Patient Education  2022 [employer].

## 2022-01-05 ENCOUNTER — Other Ambulatory Visit: Payer: Self-pay | Admitting: Primary Care

## 2022-01-05 DIAGNOSIS — Z3041 Encounter for surveillance of contraceptive pills: Secondary | ICD-10-CM

## 2022-01-05 NOTE — Telephone Encounter (Signed)
Did she move and establish with new PCP in [place] or is she still following with [place]? Anyway, she needs CPE/Follow up in August if she is still following with [place]. Let me know.

## 2022-01-09 ENCOUNTER — Other Ambulatory Visit: Payer: Self-pay | Admitting: Primary Care

## 2022-01-09 DIAGNOSIS — Z3041 Encounter for surveillance of contraceptive pills: Secondary | ICD-10-CM

## 2022-01-09 NOTE — Telephone Encounter (Signed)
Called patient set up for CPE 8/17. Would like refill sent in until then.

## 2022-01-13 NOTE — Telephone Encounter (Signed)
Patient called and stated that she is still coming here.

## 2022-01-13 NOTE — Telephone Encounter (Signed)
Left message on voicemail to call the office back. Patient has an appointment scheduled with Mayra Reel NP 02/13/22. Need to confirm.

## 2022-02-07 ENCOUNTER — Other Ambulatory Visit: Payer: Self-pay | Admitting: Primary Care

## 2022-02-07 DIAGNOSIS — Z3041 Encounter for surveillance of contraceptive pills: Secondary | ICD-10-CM

## 2022-02-13 ENCOUNTER — Ambulatory Visit (INDEPENDENT_AMBULATORY_CARE_PROVIDER_SITE_OTHER): Payer: BC Managed Care – PPO | Admitting: Primary Care

## 2022-02-13 ENCOUNTER — Encounter: Payer: Self-pay | Admitting: Primary Care

## 2022-02-13 VITALS — BP 110/62 | HR 76 | Temp 98.6°F | Ht 64.5 in | Wt 140.0 lb

## 2022-02-13 DIAGNOSIS — Z23 Encounter for immunization: Secondary | ICD-10-CM | POA: Diagnosis not present

## 2022-02-13 DIAGNOSIS — M79673 Pain in unspecified foot: Secondary | ICD-10-CM | POA: Diagnosis not present

## 2022-02-13 DIAGNOSIS — Z0000 Encounter for general adult medical examination without abnormal findings: Secondary | ICD-10-CM

## 2022-02-13 DIAGNOSIS — Z3041 Encounter for surveillance of contraceptive pills: Secondary | ICD-10-CM

## 2022-02-13 LAB — COMPREHENSIVE METABOLIC PANEL
ALT: 15 U/L (ref 0–35)
AST: 20 U/L (ref 0–37)
Albumin: 4.3 g/dL (ref 3.5–5.2)
Alkaline Phosphatase: 45 U/L (ref 39–117)
BUN: 19 mg/dL (ref 6–23)
CO2: 28 mEq/L (ref 19–32)
Calcium: 9 mg/dL (ref 8.4–10.5)
Chloride: 99 mEq/L (ref 96–112)
Creatinine, Ser: 0.84 mg/dL (ref 0.40–1.20)
GFR: 92.65 mL/min (ref >60.00)
Glucose, Bld: 82 mg/dL (ref 70–99)
Potassium: 4.1 mEq/L (ref 3.5–5.1)
Sodium: 135 mEq/L (ref 135–145)
Total Bilirubin: 0.6 mg/dL (ref 0.2–1.2)
Total Protein: 7.2 g/dL (ref 6.0–8.3)

## 2022-02-13 MED ORDER — BLISOVI FE 1/20 1-20 MG-MCG PO TABS: 1.0000 | ORAL_TABLET | Freq: Every day | ORAL | 3 refills | Status: AC

## 2022-02-13 NOTE — Assessment & Plan Note (Addendum)
Stable.   Continue Diclofenac sodium gel as needed.  I evaluated patient, was consulted regarding treatment, and agree with assessment and plan per Modesto Charon, RN, DNP student.   Mayra Reel, NP-C

## 2022-02-13 NOTE — Progress Notes (Signed)
Complete physical exam  Patient: Terry [name]   DOB: 05-19-1991   31 y.o. Female  MRN: [id number]  Subjective:    Chief Complaint  Patient presents with   Annual Exam    Not fasting     Terry [name] is a 31 y.o. female who presents today for a complete physical and follow up of chronic conditions.  Immunizations: -Tetanus: 2022 -Influenza: Due this season -Covid-19: 3 vaccines completed   Diet: Fair diet.  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 No pertinent surgical history. Family History  Problem Relation Age of Onset   Hypertension Mother    Mental retardation Mother    Cancer Maternal Grandmother        breast   Hypertension Maternal Grandmother    No Known Allergies    Patient Care Team: Doreene Nest, NP as PCP - General (Nurse Practitioner)   Outpatient Medications Prior to Visit  Medication Sig   Diclofenac Sodium 2 % SOLN Place 2 g onto the skin 2 (two) times daily.   [DISCONTINUED] BLISOVI FE 1/20 1-20 MG-MCG tablet Take 1 tablet by mouth daily. Office visit required for further refills.   No facility-administered medications prior to visit.    Review of Systems  Constitutional:  Negative for chills and fever.  HENT:  Negative for hearing loss.   Eyes:  Negative for blurred vision.  Respiratory:  Negative for shortness  of breath.   Cardiovascular:  Negative for chest pain and leg swelling.  Gastrointestinal:  Negative for abdominal pain, nausea and vomiting.  Genitourinary:  Negative for dysuria.  Musculoskeletal:  Negative for back pain and joint pain.  Skin:  Negative for itching and rash.  Neurological:  Negative for weakness and headaches.  Psychiatric/Behavioral:  The patient is not nervous/anxious and does not have insomnia.   All other systems reviewed and are negative.         Objective:     BP 110/62   Pulse 76   Temp 98.6 F (37 C) (Oral)   Ht 5' 4.5" (1.638 m)   Wt 140 lb (63.5 kg)   LMP 02/02/2022   SpO2 98%   BMI 23.66 kg/m  BP Readings from Last 3 Encounters:  02/13/22 110/62  02/05/21 104/60  01/10/20 120/72      Physical Exam Vitals and nursing note reviewed.  Constitutional:      Appearance: Normal appearance.  HENT:     Head: Normocephalic.     Right Ear: Tympanic membrane, ear canal and external ear normal.     Left Ear: Tympanic membrane, ear canal and external ear normal.     Nose: Nose normal.     Mouth/Throat:  Pharynx: Oropharynx is clear.  Eyes:     Extraocular Movements: Extraocular movements intact.     Conjunctiva/sclera: Conjunctivae normal.     Pupils: Pupils are equal, round, and reactive to light.  Cardiovascular:     Rate and Rhythm: Normal rate and regular rhythm.     Pulses: Normal pulses.     Heart sounds: Normal heart sounds.  Pulmonary:     Effort: Pulmonary effort is normal.     Breath sounds: Normal breath sounds.  Abdominal:     General: Bowel sounds are normal.     Palpations: Abdomen is soft.  Musculoskeletal:        General: Normal range of motion.  Skin:    General: Skin is warm and dry.  Neurological:     Mental Status: She is alert and oriented to person, place, and time.  Psychiatric:        Mood and Affect: Mood normal.        Behavior: Behavior normal.      No results found for any visits on 02/13/22.      Assessment & Plan:    Routine Health Maintenance and Physical Exam  Immunization History  Administered Date(s) Administered   Hepatitis B 10/28/1999, 04/01/2002, 05/20/2002   Influenza,inj,Quad PF,6+ Mos 04/14/2019   Influenza-Unspecified 03/30/2017, 04/23/2021   Moderna Sars-Covid-2 Vaccination 08/27/2019, 09/28/2019, 05/01/2020   PPD Test 01/27/2022   Tdap 03/09/2009, 02/05/2021    Health Maintenance  Topic Date Due   INFLUENZA VACCINE  01/28/2022   COVID-19 Vaccine (4 - Moderna series) 03/01/2022 (Originally 06/26/2020)   PAP SMEAR-Modifier  01/10/2023   TETANUS/TDAP  02/06/2031   Hepatitis C Screening  Completed   HIV Screening  Completed   HPV VACCINES  Aged Out    Discussed health benefits of physical activity, and encouraged her to engage in regular exercise appropriate for her age and condition.  Problem List Items Addressed This Visit       Other   Preventative health care - Primary    Flu shot provided today.  Pap smear UTD.   Commended her on regular exercise and healthy diet.   Exam today stable.  Labs reviewed from October, 2022. CMP pending.  I evaluated patient, was consulted regarding treatment, and agree with assessment and plan per Terry Charon, RN, DNP student.   Terry Reel, NP-C       Relevant Orders   Comprehensive metabolic panel   Pain of foot    Stable.   Continue Diclofenac sodium gel as needed.  I evaluated patient, was consulted regarding treatment, and agree with assessment and plan per Terry Charon, RN, DNP student.   Terry Reel, NP-C       Encounter for birth control    Doing well on OCP's.  Continue Blisovi 1/20 mg-mcg.  Pap smear up to date.  I evaluated patient, was consulted regarding treatment, and agree with assessment and plan per Terry Charon, RN, DNP student.   Terry Reel, NP-C       Relevant Medications   BLISOVI FE 1/20 1-20 MG-MCG tablet   No follow-ups on file.     Terry [name], BSN-RN, DNP  STUDENT

## 2022-02-13 NOTE — Patient Instructions (Addendum)
Stop by the lab prior to leaving today. I will notify you of your results once received.    Refills for you birth control sent.   See you next year for your physical.  It was a pleasure to see you today!   Influenza (Flu) Vaccine (Inactivated or Recombinant): What You Need to Know 1. Why get vaccinated? Influenza vaccine can prevent influenza (flu). Flu is a contagious disease that spreads around the [place] every year, usually between October and May. Anyone can get the flu, but it is more dangerous for some people. Infants and young children, people 66 years and older, pregnant people, and people with certain health conditions or a weakened immune system are at greatest risk of flu complications. Pneumonia, bronchitis, sinus infections, and ear infections are examples of flu-related complications. If you have a medical condition, such as heart disease, cancer, or diabetes, flu can make it worse. Flu can cause fever and chills, sore throat, muscle aches, fatigue, cough, headache, and runny or stuffy nose. Some people may have vomiting and diarrhea, though this is more common in children than adults. In an average year, thousands of people in the [place] die from flu, and many more are hospitalized. Flu vaccine prevents millions of illnesses and flu-related visits to the doctor each year. 2. Influenza vaccines CDC recommends everyone 6 months and older get vaccinated every flu season. Children 6 months through 41 years of age may need 2 doses during a single flu season. Everyone else needs only 1 dose each flu season. It takes about 2 weeks for protection to develop after vaccination. There are many flu viruses, and they are always changing. Each year a new flu vaccine is made to protect against the influenza viruses believed to be likely to cause disease in the upcoming flu season. Even when the vaccine doesn't exactly match these viruses, it may still provide some  protection. Influenza vaccine does not cause flu. Influenza vaccine may be given at the same time as other vaccines. 3. Talk with your health care provider Tell your vaccination provider if the person getting the vaccine: Has had an allergic reaction after a previous dose of influenza vaccine, or has any severe, life-threatening allergies Has ever had Guillain-Barr Syndrome (also called "GBS") In some cases, your health care provider may decide to postpone influenza vaccination until a future visit. Influenza vaccine can be administered at any time during pregnancy. People who are or will be pregnant during influenza season should receive inactivated influenza vaccine. People with minor illnesses, such as a cold, may be vaccinated. People who are moderately or severely ill should usually wait until they recover before getting influenza vaccine. Your health care provider can give you more information. 4. Risks of a vaccine reaction Soreness, redness, and swelling where the shot is given, fever, muscle aches, and headache can happen after influenza vaccination. There may be a very small increased risk of Guillain-Barr Syndrome (GBS) after inactivated influenza vaccine (the flu shot). Young children who get the flu shot along with pneumococcal vaccine (PCV13) and/or DTaP vaccine at the same time might be slightly more likely to have a seizure caused by fever. Tell your health care provider if a child who is getting flu vaccine has ever had a seizure. People sometimes faint after medical procedures, including vaccination. Tell your provider if you feel dizzy or have vision changes or ringing in the ears. As with any medicine, there is a very remote chance of a vaccine causing a  severe allergic reaction, other serious injury, or death. 5. What if there is a serious problem? An allergic reaction could occur after the vaccinated person leaves the clinic. If you see signs of a severe allergic reaction  (hives, swelling of the face and throat, difficulty breathing, a fast heartbeat, dizziness, or weakness), call 9-1-1 and get the person to the nearest hospital. For other signs that concern you, call your health care provider. Adverse reactions should be reported to the Vaccine Adverse Event Reporting System (VAERS). Your health care provider will usually file this report, or you can do it yourself. Visit the VAERS website at www.vaers.LAgents.no or call 608-814-2727. VAERS is only for reporting reactions, and VAERS staff members do not give medical advice. 6. The National Vaccine Injury Compensation Program The [employer] Vaccine Injury Compensation Program (VICP) is a federal program that was created to compensate people who may have been injured by certain vaccines. Claims regarding alleged injury or death due to vaccination have a time limit for filing, which may be as short as two years. Visit the VICP website at SpiritualWord.at or call 816 052 9625 to learn about the program and about filing a claim. 7. How can I learn more? Ask your health care provider. Call your local or state health department. Visit the website of the Food and Drug Administration (FDA) for vaccine package inserts and additional information at FinderList.no. Contact the Centers for Disease Control and Prevention (CDC): Call 220-103-6293 (1-800-CDC-INFO) or Visit CDC's website at BiotechRoom.com.cy. Source: CDC Vaccine Information Statement Inactivated Influenza Vaccine (02/03/2020) This same material is available at FootballExhibition.com.br for no charge. This information is not intended to replace advice given to you by your health care provider. Make sure you discuss any questions you have with your health care provider. Document Revised: 05/15/2021 Document Reviewed: 03/07/2021 Elsevier Patient Education  2023 [employer].

## 2022-02-13 NOTE — Assessment & Plan Note (Addendum)
Flu shot provided today.  Pap smear UTD.   Commended her on regular exercise and healthy diet.   Exam today stable.  Labs reviewed from October, 2022. CMP pending.  I evaluated patient, was consulted regarding treatment, and agree with assessment and plan per Modesto Charon, RN, DNP student.   Mayra Reel, NP-C

## 2022-02-13 NOTE — Progress Notes (Signed)
Subjective:    Patient ID: Terry [name], female    DOB: 1991-06-13, 31 y.o.   MRN: [id number]  HPI  Terry [name] is a very pleasant 31 y.o. female who presents today for complete physical and follow up of chronic conditions.  Immunizations: -Tetanus: 2022 -Influenza: completed today -Covid-19: 3 vaccines   Diet: Fair diet.  Exercise: No regular exercise.  Eye exam: Completes annually  Dental exam: Completes semi-annually   Pap Smear: Completed in 2021  BP Readings from Last 3 Encounters:  02/13/22 110/62  02/05/21 104/60  01/10/20 120/72        Review of Systems  Constitutional:  Negative for unexpected weight change.  HENT:  Negative for rhinorrhea.   Respiratory:  Negative for cough and shortness of breath.   Cardiovascular:  Negative for chest pain.  Gastrointestinal:  Negative for constipation and diarrhea.  Genitourinary:  Negative for difficulty urinating and menstrual problem.  Musculoskeletal:  Negative for arthralgias and myalgias.  Skin:  Negative for rash.  Allergic/Immunologic: Negative for environmental allergies.  Neurological:  Negative for dizziness and headaches.  Psychiatric/Behavioral:  The patient is not nervous/anxious.          Past Medical History:  Diagnosis Date   Acute anxiety 02/14/2019   Chicken pox     Social History   Socioeconomic History   Marital status: Single    Spouse name: Not on file   Number of children: Not on file   Years of education: Not on file   Highest education level: Not on file  Occupational History   Not on file  Tobacco Use   Smoking status: Never   Smokeless tobacco: Never  Substance and Sexual Activity   Alcohol use: Yes    Alcohol/week: 0.0 standard drinks of alcohol    Comment: social   Drug use: Not on file   Sexual activity: Not on file  Other Topics Concern   Not on file  Social History Narrative   Single.   Highest level of education completed Bachelors Degree.   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   Enjoys reading, running, hiking, camping.   Social Determinants of Health   Financial Resource Strain: Not on file  Food Insecurity: Not on file  Transportation Needs: Not on file  Physical Activity: Not on file  Stress: Not on file  Social Connections: Not on file  Intimate Partner Violence: Not on file    History reviewed. No pertinent surgical history.  Family History  Problem Relation Age of Onset   Hypertension Mother    Mental retardation Mother    Cancer Maternal Grandmother        breast   Hypertension Maternal Grandmother     No Known Allergies  Current Outpatient Medications on File Prior to Visit  Medication Sig Dispense Refill   Diclofenac Sodium 2 % SOLN Place 2 g onto the skin 2 (two) times daily. 112 g 3   No current facility-administered medications on file prior to visit.    BP 110/62   Pulse 76   Temp 98.6 F (37 C) (Oral)   Ht 5' 4.5" (1.638 m)   Wt 140 lb (63.5 kg)   LMP 02/02/2022   SpO2 98%   BMI 23.66 kg/m  Objective:   Physical Exam HENT:     Right Ear: Tympanic membrane and ear canal normal.     Left Ear: Tympanic membrane and ear canal normal.     Nose: Nose  normal.  Eyes:     Conjunctiva/sclera: Conjunctivae normal.     Pupils: Pupils are equal, round, and reactive to light.  Neck:     Thyroid: No thyromegaly.  Cardiovascular:     Rate and Rhythm: Normal rate and regular rhythm.     Heart sounds: No murmur heard. Pulmonary:     Effort: Pulmonary effort is normal.     Breath sounds: Normal breath sounds. No rales.  Abdominal:     General: Bowel sounds are normal.     Palpations: Abdomen is soft.     Tenderness: There is no abdominal tenderness.  Musculoskeletal:        General: Normal range of motion.     Cervical back: Neck supple.  Lymphadenopathy:     Cervical: No cervical adenopathy.  Skin:    General: Skin is warm and dry.     Findings: No rash.  Neurological:      Mental Status: She is alert and oriented to person, place, and time.     Cranial Nerves: No cranial nerve deficit.     Deep Tendon Reflexes: Reflexes are normal and symmetric.  Psychiatric:        Mood and Affect: Mood normal.           Assessment & Plan:   Problem List Items Addressed This Visit       Other   Preventative health care - Primary    Flu shot provided today.  Pap smear UTD.   Commended her on regular exercise and healthy diet.   Exam today stable.  Labs reviewed from October, 2022. CMP pending.  I evaluated patient, was consulted regarding treatment, and agree with assessment and plan per Modesto Charon, RN, DNP student.   Mayra Reel, NP-C       Relevant Orders   Comprehensive metabolic panel   Pain of foot    Stable.   Continue Diclofenac sodium gel as needed.  I evaluated patient, was consulted regarding treatment, and agree with assessment and plan per Modesto Charon, RN, DNP student.   Mayra Reel, NP-C       Encounter for birth control    Doing well on OCP's.  Continue Blisovi 1/20 mg-mcg.  Pap smear up to date.  I evaluated patient, was consulted regarding treatment, and agree with assessment and plan per Modesto Charon, RN, DNP student.   Mayra Reel, NP-C       Relevant Medications   BLISOVI FE 1/20 1-20 MG-MCG tablet       Doreene Nest, NP

## 2022-02-13 NOTE — Assessment & Plan Note (Addendum)
Doing well on OCP's.  Continue Blisovi 1/20 mg-mcg.  Pap smear up to date.  I evaluated patient, was consulted regarding treatment, and agree with assessment and plan per Modesto Charon, RN, DNP student.   Mayra Reel, NP-C

## 2022-03-02 ENCOUNTER — Other Ambulatory Visit: Payer: Self-pay | Admitting: Primary Care

## 2022-03-02 DIAGNOSIS — Z3041 Encounter for surveillance of contraceptive pills: Secondary | ICD-10-CM

## 2023-02-01 ENCOUNTER — Other Ambulatory Visit: Payer: Self-pay | Admitting: Primary Care

## 2023-02-01 DIAGNOSIS — Z3041 Encounter for surveillance of contraceptive pills: Secondary | ICD-10-CM

## 2023-02-01 NOTE — Telephone Encounter (Signed)
Please call patient:  Received refill of her birth control. She is due for CPE after 02/14/23. Did she establish with someone closer to where she lives or does she still follow with [place]? Happy to see her if she's still with [place], just needs to be seen soon.

## 2023-02-03 NOTE — Telephone Encounter (Signed)
Please call patient to find out the following questions for Terry [name]. Please let us know if appointment is made.

## 2023-02-03 NOTE — Telephone Encounter (Signed)
Spoke to pt, pt stated she found a provider near her. Pt states she doesn't need [facility] to refill her bc pills.

## 2023-02-05 NOTE — Telephone Encounter (Signed)
Noted.  Removed myself his PCP. Will decline refill request.
